# Patient Record
Sex: Male | Born: 1970 | Race: White | Hispanic: No | Marital: Married | Smoking: Current every day smoker
Health system: Southern US, Community
[De-identification: ages and names within clinical notes are randomized; demographics above are authoritative.]

## PROBLEM LIST (undated history)

## (undated) DIAGNOSIS — M549 Dorsalgia, unspecified: Secondary | ICD-10-CM

## (undated) DIAGNOSIS — M7710 Lateral epicondylitis, unspecified elbow: Secondary | ICD-10-CM

## (undated) DIAGNOSIS — E785 Hyperlipidemia, unspecified: Secondary | ICD-10-CM

## (undated) DIAGNOSIS — H269 Unspecified cataract: Secondary | ICD-10-CM

## (undated) HISTORY — DX: Hyperlipidemia, unspecified: E78.5

## (undated) HISTORY — DX: Dorsalgia, unspecified: M54.9

## (undated) HISTORY — DX: Unspecified cataract: H26.9

## (undated) HISTORY — DX: Lateral epicondylitis, unspecified elbow: M77.10

## (undated) HISTORY — PX: FRACTURE SURGERY: SHX138

## (undated) HISTORY — PX: CATARACT EXTRACTION: SUR2

## (undated) HISTORY — PX: LEG SURGERY: SHX1003

---

## 2009-05-20 ENCOUNTER — Ambulatory Visit: Payer: Self-pay | Admitting: Family Medicine

## 2010-03-07 ENCOUNTER — Ambulatory Visit: Payer: Self-pay | Admitting: Chiropractor

## 2011-10-29 ENCOUNTER — Ambulatory Visit: Payer: Self-pay | Admitting: Internal Medicine

## 2015-05-08 ENCOUNTER — Other Ambulatory Visit: Payer: Self-pay | Admitting: Internal Medicine

## 2015-07-04 ENCOUNTER — Other Ambulatory Visit: Payer: Self-pay | Admitting: Internal Medicine

## 2015-07-07 ENCOUNTER — Other Ambulatory Visit: Payer: Self-pay | Admitting: Internal Medicine

## 2015-07-09 ENCOUNTER — Encounter: Payer: Self-pay | Admitting: Family Medicine

## 2015-07-09 ENCOUNTER — Ambulatory Visit (INDEPENDENT_AMBULATORY_CARE_PROVIDER_SITE_OTHER): Payer: No Typology Code available for payment source | Admitting: Family Medicine

## 2015-07-09 VITALS — BP 120/80 | HR 72 | Ht 72.0 in | Wt 225.0 lb

## 2015-07-09 DIAGNOSIS — L0100 Impetigo, unspecified: Secondary | ICD-10-CM

## 2015-07-09 MED ORDER — SULFAMETHOXAZOLE-TRIMETHOPRIM 800-160 MG PO TABS: 1.0000 | ORAL_TABLET | Freq: Two times a day (BID) | ORAL | Status: DC

## 2015-07-09 MED ORDER — BACITRACIN-NEOMYCIN-POLYMYXIN 400-5-5000 EX OINT: 1.0000 "application " | TOPICAL_OINTMENT | Freq: Two times a day (BID) | CUTANEOUS | Status: DC

## 2015-07-09 NOTE — Progress Notes (Signed)
Name: Kevin Cobb   MRN: [id number]    DOB: [birth year]   Date:07/09/2015       Progress Note  Subjective  Chief Complaint  Chief Complaint  Patient presents with  . Sinusitis    inside nose is red, no drainage, no cough    Sinusitis This is a new problem. The current episode started 1 to 4 weeks ago. The problem is unchanged. There has been no fever. The pain is mild. Pertinent negatives include no chills, congestion, coughing, diaphoresis, ear pain, headaches, hoarse voice, neck pain, shortness of breath, sinus pressure, sneezing, sore throat or swollen glands. Past treatments include saline sprays. The treatment provided no relief.    No problem-specific assessment & plan notes found for this encounter.   History reviewed. No pertinent past medical history.  History reviewed. No pertinent past surgical history.  History reviewed. No pertinent family history.  Social History   Social History  . Marital Status: Married    Spouse Name: N/A  . Number of Children: N/A  . Years of Education: N/A   Occupational History  . Not on file.   Social History Main Topics  . Smoking status: Current Every Day Smoker  . Smokeless tobacco: Not on file  . Alcohol Use: No  . Drug Use: No  . Sexual Activity: Not Currently   Other Topics Concern  . Not on file   Social History Narrative  . No narrative on file    No Known Allergies   Review of Systems  Constitutional: Negative for fever, chills, weight loss, malaise/fatigue and diaphoresis.  HENT: Negative for congestion, ear discharge, ear pain, hoarse voice, sinus pressure, sneezing and sore throat.   Eyes: Negative for blurred vision.  Respiratory: Negative for cough, sputum production, shortness of breath and wheezing.   Cardiovascular: Negative for chest pain, palpitations and leg swelling.  Gastrointestinal: Negative for heartburn, nausea, abdominal pain, diarrhea, constipation, blood in stool and melena.   Genitourinary: Negative for dysuria, urgency, frequency and hematuria.  Musculoskeletal: Negative for myalgias, back pain, joint pain and neck pain.  Skin: Negative for rash.  Neurological: Negative for dizziness, tingling, sensory change, focal weakness and headaches.  Endo/Heme/Allergies: Negative for environmental allergies and polydipsia. Does not bruise/bleed easily.  Psychiatric/Behavioral: Negative for depression and suicidal ideas. The patient is not nervous/anxious and does not have insomnia.      Objective  Filed Vitals:   07/09/15 1446  BP: 120/80  Pulse: 72  Height: 6' (1.829 m)  Weight: 225 lb (102.059 kg)    Physical Exam  Constitutional: He is oriented to person, place, and time and well-developed, well-nourished, and in no distress.  HENT:  Head: Normocephalic.  Right Ear: External ear normal.  Left Ear: External ear normal.  Nose: Mucosal edema and sinus tenderness present.  Mouth/Throat: Oropharynx is clear and moist.  Honey crusted mucosa  Eyes: Conjunctivae and EOM are normal. Pupils are equal, round, and reactive to light. Right eye exhibits no discharge. Left eye exhibits no discharge. No scleral icterus.  Neck: Normal range of motion. Neck supple. No JVD present. No tracheal deviation present. No thyromegaly present.  Cardiovascular: Normal rate, regular rhythm, normal heart sounds and intact distal pulses.  Exam reveals no gallop and no friction rub.   No murmur heard. Pulmonary/Chest: Breath sounds normal. No respiratory distress. He has no wheezes. He has no rales.  Abdominal: Soft. Bowel sounds are normal. He exhibits no mass. There is no hepatosplenomegaly. There is no tenderness. There  is no rebound, no guarding and no CVA tenderness.  Musculoskeletal: Normal range of motion. He exhibits no edema or tenderness.  Lymphadenopathy:    He has no cervical adenopathy.  Neurological: He is alert and oriented to person, place, and time. He has normal  sensation, normal strength, normal reflexes and intact cranial nerves. No cranial nerve deficit.  Skin: Skin is warm. No rash noted.  Psychiatric: Mood and affect normal.      Assessment & Plan  Problem List Items Addressed This Visit    None    Visit Diagnoses    Impetigo    -  Primary    Relevant Medications    neomycin-bacitracin-polymyxin (NEOSPORIN) ointment    sulfamethoxazole-trimethoprim (BACTRIM DS,SEPTRA DS) 800-160 MG per tablet         Dr. [facility]  07/09/2015

## 2015-07-09 NOTE — Patient Instructions (Signed)
Community-Associated MRSA CA-MRSA stands for community-associated methicillin-resistant Staphylococcus aureus. MRSA is a type of bacteria that is resistant to some common antibiotics. It can cause infections in the skin and many other places in the body. Staphylococcus aureus, often called "staph," is a bacteria that normally lives on the skin or in the nose. Staph on the surface of the skin or in the nose does not cause problems. However, if the staph enters the body through a cut, wound, or break in the skin, an infection can happen. Up until recently, infections with the MRSA type of staph mainly occurred in hospitals and other health care settings. There are now increasing problems with MRSA infections in the community as well. Infections with MRSA may be very serious or even life threatening. CA-MRSA is becoming more common. It is known to spread in crowded settings, in jails and prisons, and in situations where there is close skin-to-skin contact, such as during sporting events or in locker rooms. MRSA can be spread through shared items, such as children's toys, razors, towels, or sports equipment.  CAUSES All staph, including MRSA, are normally harmless unless they enter the body through a scratch, cut, or wound, such as with surgery. All staph, including MRSA, can be spread from person-to-person by touching contaminated objects or through direct contact.  MRSA now causes illness in people who have not been in hospitals or other health care facilities. Cases of MRSA diseases in the community have been associated with:  Recent antibiotic use.  Sharing contaminated towels or clothes.  Having active skin diseases.  Participating in contact sports.  Living in crowded settings.  Intravenous (IV) drug use.  Community-associated MRSA infections are usually skin infections, but may cause other severe illnesses.  Staph bacteria are one of the most common causes of skin infection. However, they  are also a common cause of pneumonia, bone or joint infections, and bloodstream infections. DIAGNOSIS Diagnosis of MRSA is done by cultures of fluid samples that may come from:  Swabs taken from cuts or wounds in infected areas.  Nasal swabs.  Saliva or deep cough specimens from the lungs (sputum).  Urine.  Blood. Many people are "colonized" with MRSA but have no signs of infection. This means that people carry the MRSA germ on their skin or in their nose and may never develop MRSA infection.  TREATMENT  Treatment varies and is based on how serious, how deep, or how extensive the infection is. For example:  Some skin infections, such as a small boil or abscess, may be treated by draining yellowish-white fluid (pus) from the site of the infection.  Deeper or more widespread soft tissue infections are usually treated with surgery to drain pus and with antibiotic medicine given by vein or by mouth. This may be recommended even if you are pregnant.  Serious infections may require a hospital stay. If antibiotics are given, they may be needed for several weeks. PREVENTION Because many people are colonized with staph, including MRSA, preventing the spread of the bacteria from person-to-person is most important. The best way to prevent the spread of bacteria and other germs is through proper hand washing or by using alcohol-based hand disinfectants. The following are other ways to help prevent MRSA infection within community settings.   Wash your hands frequently with soap and water for at least 15 seconds. Otherwise, use alcohol-based hand disinfectants when soap and water is not available.  Make sure people who live with you wash their hands often, too.  Do not share personal items. For example, avoid sharing razors and other personal hygiene items, towels, clothing, and athletic equipment.  Wash and dry your clothes and bedding at the warmest temperatures recommended on the labels.  Keep  wounds covered. Pus from infected sores may contain MRSA and other bacteria. Keep cuts and abrasions clean and covered with germ-free (sterile), dry bandages until they are healed.  If you have a wound that appears infected, ask your caregiver if a culture for MRSA and other bacteria should be done.  If you are breastfeeding, talk to your caregiver about MRSA. You may be asked to temporarily stop breastfeeding. HOME CARE INSTRUCTIONS   Take your antibiotics as directed. Finish them even if you start to feel better.  Avoid close contact with those around you as much as possible. Do not use towels, razors, toothbrushes, bedding, or other items that will be used by others.  To fight the infection, follow your caregiver's instructions for wound care. Wash your hands before and after changing your bandages.  If you have an intravascular device, such as a catheter, make sure you know how to care for it.  Be sure to tell any health care providers that you have MRSA so they are aware of your infection. SEEK IMMEDIATE MEDICAL CARE IF:  The infection appears to be getting worse. Signs include:  Increased warmth, redness, or tenderness around the wound site.  A red line that extends from the infection site.  A dark color in the area around the infection.  Wound drainage that is tan, yellow, or green.  A bad smell coming from the wound.  You feel sick to your stomach (nauseous) and throw up (vomit) or cannot keep medicine down.  You have a fever.  Your baby is older than 3 months with a rectal temperature of 102F (38.9C) or higher.  Your baby is 66 months old or younger with a rectal temperature of 100.46F (38C) or higher.  You have difficulty breathing. MAKE SURE YOU:   Understand these instructions.  Will watch your condition.  Will get help right away if you are not doing well or get worse. Document Released: 02/12/2006 Document Revised: 03/26/2014 Document Reviewed:  02/12/2011 [facility] Patient Information 2015 [place], [place]. This information is not intended to replace advice given to you by your health care provider. Make sure you discuss any questions you have with your health care provider. Place pediatric impetigo patient instructions here.

## 2015-10-20 ENCOUNTER — Encounter: Payer: Self-pay | Admitting: Internal Medicine

## 2015-10-20 ENCOUNTER — Other Ambulatory Visit: Payer: Self-pay | Admitting: Internal Medicine

## 2015-10-20 DIAGNOSIS — E7800 Pure hypercholesterolemia, unspecified: Secondary | ICD-10-CM | POA: Insufficient documentation

## 2015-10-20 DIAGNOSIS — F17210 Nicotine dependence, cigarettes, uncomplicated: Secondary | ICD-10-CM | POA: Insufficient documentation

## 2015-10-20 DIAGNOSIS — Z720 Tobacco use: Secondary | ICD-10-CM | POA: Insufficient documentation

## 2015-10-20 DIAGNOSIS — R740 Nonspecific elevation of levels of transaminase and lactic acid dehydrogenase [LDH]: Secondary | ICD-10-CM

## 2015-10-20 DIAGNOSIS — M722 Plantar fascial fibromatosis: Secondary | ICD-10-CM | POA: Insufficient documentation

## 2015-10-20 DIAGNOSIS — IMO0002 Reserved for concepts with insufficient information to code with codable children: Secondary | ICD-10-CM | POA: Insufficient documentation

## 2015-10-21 ENCOUNTER — Encounter: Payer: Self-pay | Admitting: Internal Medicine

## 2015-10-28 ENCOUNTER — Ambulatory Visit: Payer: No Typology Code available for payment source | Admitting: Internal Medicine

## 2015-12-15 ENCOUNTER — Other Ambulatory Visit: Payer: Self-pay | Admitting: Internal Medicine

## 2015-12-15 ENCOUNTER — Encounter: Payer: Self-pay | Admitting: Internal Medicine

## 2015-12-15 DIAGNOSIS — R7303 Prediabetes: Secondary | ICD-10-CM | POA: Insufficient documentation

## 2015-12-25 NOTE — Telephone Encounter (Signed)
pts coming in on 2/27 for follow up and in may for cpe

## 2016-01-10 ENCOUNTER — Other Ambulatory Visit: Payer: Self-pay | Admitting: Internal Medicine

## 2016-01-10 ENCOUNTER — Ambulatory Visit (INDEPENDENT_AMBULATORY_CARE_PROVIDER_SITE_OTHER): Payer: No Typology Code available for payment source | Admitting: Internal Medicine

## 2016-01-10 ENCOUNTER — Encounter: Payer: Self-pay | Admitting: Internal Medicine

## 2016-01-10 VITALS — BP 116/66 | HR 89 | Temp 98.6°F | Ht 72.0 in | Wt 228.0 lb

## 2016-01-10 DIAGNOSIS — J0101 Acute recurrent maxillary sinusitis: Secondary | ICD-10-CM | POA: Diagnosis not present

## 2016-01-10 MED ORDER — GUAIFENESIN-CODEINE 100-10 MG/5ML PO SYRP: 5.0000 mL | ORAL_SOLUTION | Freq: Three times a day (TID) | ORAL | Status: DC | PRN

## 2016-01-10 MED ORDER — AMOXICILLIN-POT CLAVULANATE 875-125 MG PO TABS: 1.0000 | ORAL_TABLET | Freq: Two times a day (BID) | ORAL | Status: DC

## 2016-01-10 NOTE — Progress Notes (Signed)
Date:  01/10/2016   Name:  Kevin Cobb   DOB:  03-Feb-1971   MRN:  [id number]   Chief Complaint: Sinusitis Sinusitis This is a chronic problem. The current episode started more than 1 month ago. The problem is unchanged. There has been no fever. He is experiencing no pain. Associated symptoms include congestion, coughing, ear pain, sinus pressure and a sore throat. Pertinent negatives include no headaches or shortness of breath. Past treatments include oral decongestants. The treatment provided mild relief.      Review of Systems  HENT: Positive for congestion, ear pain, sinus pressure and sore throat.   Respiratory: Positive for cough. Negative for chest tightness, shortness of breath and wheezing.   Cardiovascular: Negative for chest pain and palpitations.  Gastrointestinal: Negative for vomiting and diarrhea.  Neurological: Negative for headaches.    Patient Active Problem List   Diagnosis Date Noted  . Pre-diabetes 12/15/2015  . Elevation of level of transaminase or lactic acid dehydrogenase (LDH) 10/20/2015  . Current tobacco use 10/20/2015  . Hypercholesteremia 10/20/2015  . Plantar fasciitis 10/20/2015    Prior to Admission medications   Medication Sig Start Date End Date Taking? Authorizing Provider  aspirin 81 MG tablet Take 81 mg by mouth daily.    Historical Provider, MD  cyclobenzaprine (FLEXERIL) 10 MG tablet TAKE 1 TABLET BY MOUTH AT BEDTIME 12/15/15   Glean Hess, MD  meloxicam (MOBIC) 15 MG tablet TAKE 1 TABLET BY MOUTH DAILY 12/15/15   Glean Hess, MD  neomycin-bacitracin-polymyxin (NEOSPORIN) ointment Apply 1 application topically every 12 (twelve) hours. apply to nasal mucosa 07/09/15   Juline Patch, MD  simvastatin (ZOCOR) 20 MG tablet TAKE 1 TABLET BY MOUTH EVERY DAY 07/07/15   Glean Hess, MD  simvastatin (ZOCOR) 20 MG tablet TAKE 1 TABLET BY MOUTH EVERY DAY 01/10/16   Glean Hess, MD    No Known Allergies  Past Surgical History    Procedure Laterality Date  . Leg surgery Right     childhood    Social History  Substance Use Topics  . Smoking status: Current Every Day Smoker -- 1.00 packs/day for 25 years    Types: Cigarettes  . Smokeless tobacco: None  . Alcohol Use: No     Medication list has been reviewed and updated.   Physical Exam  Constitutional: He is oriented to person, place, and time. He appears well-developed and well-nourished.  HENT:  Right Ear: External ear normal.  Nose: Right sinus exhibits maxillary sinus tenderness and frontal sinus tenderness. Left sinus exhibits maxillary sinus tenderness and frontal sinus tenderness.  Mouth/Throat: Uvula is midline and mucous membranes are normal. No oral lesions. Posterior oropharyngeal erythema present. No oropharyngeal exudate.  Bilateral excessive cerumen  Cardiovascular: Normal rate, regular rhythm and normal heart sounds.   Pulmonary/Chest: Breath sounds normal. He has no wheezes. He has no rales.  Lymphadenopathy:    He has no cervical adenopathy.  Neurological: He is alert and oriented to person, place, and time.    BP 116/66 mmHg  Pulse 89  Temp(Src) 98.6 F (37 C) (Oral)  Ht 6' (1.829 m)  Wt 228 lb (103.42 kg)  BMI 30.92 kg/m2  SpO2 98%  Assessment and Plan: 1. Acute recurrent maxillary sinusitis Continue Mucinex Sinus bid Increase fluid intake - amoxicillin-clavulanate (AUGMENTIN) 875-125 MG tablet; Take 1 tablet by mouth 2 (two) times daily.  Dispense: 20 tablet; Refill: 0 - guaiFENesin-codeine (ROBITUSSIN AC) 100-10 MG/5ML syrup; Take 5 mLs  by mouth 3 (three) times daily as needed for cough.  Dispense: 150 mL; Refill: 0   Halina Maidens, MD [facility]  01/10/2016

## 2016-01-20 ENCOUNTER — Ambulatory Visit: Payer: No Typology Code available for payment source | Admitting: Internal Medicine

## 2016-01-23 ENCOUNTER — Other Ambulatory Visit: Payer: Self-pay | Admitting: Internal Medicine

## 2016-01-29 ENCOUNTER — Other Ambulatory Visit: Payer: Self-pay | Admitting: Internal Medicine

## 2016-04-08 ENCOUNTER — Other Ambulatory Visit: Payer: Self-pay | Admitting: Internal Medicine

## 2016-04-08 ENCOUNTER — Ambulatory Visit (INDEPENDENT_AMBULATORY_CARE_PROVIDER_SITE_OTHER): Payer: No Typology Code available for payment source | Admitting: Internal Medicine

## 2016-04-08 ENCOUNTER — Encounter: Payer: Self-pay | Admitting: Internal Medicine

## 2016-04-08 VITALS — BP 108/78 | HR 91 | Resp 16 | Ht 72.0 in | Wt 230.0 lb

## 2016-04-08 DIAGNOSIS — R740 Nonspecific elevation of levels of transaminase and lactic acid dehydrogenase [LDH]: Secondary | ICD-10-CM

## 2016-04-08 DIAGNOSIS — J0110 Acute frontal sinusitis, unspecified: Secondary | ICD-10-CM | POA: Diagnosis not present

## 2016-04-08 DIAGNOSIS — M545 Low back pain, unspecified: Secondary | ICD-10-CM | POA: Insufficient documentation

## 2016-04-08 DIAGNOSIS — IMO0002 Reserved for concepts with insufficient information to code with codable children: Secondary | ICD-10-CM

## 2016-04-08 DIAGNOSIS — E7800 Pure hypercholesterolemia, unspecified: Secondary | ICD-10-CM | POA: Diagnosis not present

## 2016-04-08 DIAGNOSIS — J0101 Acute recurrent maxillary sinusitis: Secondary | ICD-10-CM | POA: Diagnosis not present

## 2016-04-08 DIAGNOSIS — R7303 Prediabetes: Secondary | ICD-10-CM | POA: Diagnosis not present

## 2016-04-08 DIAGNOSIS — Z0000 Encounter for general adult medical examination without abnormal findings: Secondary | ICD-10-CM

## 2016-04-08 DIAGNOSIS — Z720 Tobacco use: Secondary | ICD-10-CM

## 2016-04-08 LAB — POCT URINALYSIS DIPSTICK
BILIRUBIN UA: NEGATIVE
Blood, UA: NEGATIVE
Glucose, UA: NEGATIVE
Ketones, UA: NEGATIVE
LEUKOCYTES UA: NEGATIVE
NITRITE UA: NEGATIVE
PH UA: 6.5
Protein, UA: NEGATIVE
Spec Grav, UA: 1.015
UROBILINOGEN UA: 0.2

## 2016-04-08 MED ORDER — AMOXICILLIN-POT CLAVULANATE 875-125 MG PO TABS: 1.0000 | ORAL_TABLET | Freq: Two times a day (BID) | ORAL | Status: DC

## 2016-04-08 MED ORDER — GUAIFENESIN-CODEINE 100-10 MG/5ML PO SYRP: 5.0000 mL | ORAL_SOLUTION | Freq: Three times a day (TID) | ORAL | Status: DC | PRN

## 2016-04-08 NOTE — Progress Notes (Signed)
Date:  04/08/2016   Name:  NIKODEM LEADBETTER   DOB:  10-Jul-1971   MRN:  [id number]   Chief Complaint: Annual Exam; Nicotine Dependence; and Sinus Problem Dieter GAETANO ROMBERGER is a 45 y.o. male who presents today for his Complete Annual Exam. He feels fairly well. He reports exercising very little. He reports he is sleeping well.   Nicotine Dependence Symptoms include sore throat. Symptoms are negative for fatigue. His urge triggers include company of smokers.  Sinus Problem Associated symptoms include congestion, coughing, sinus pressure and a sore throat. Pertinent negatives include no chills, diaphoresis, headaches or shortness of breath.  Hyperlipidemia This is a chronic problem. The current episode started more than 1 year ago. The problem is controlled. Recent lipid tests were reviewed and are normal. Pertinent negatives include no chest pain, myalgias or shortness of breath. Current antihyperlipidemic treatment includes statins. The current treatment provides significant improvement of lipids. There are no compliance problems.      Review of Systems  Constitutional: Negative for chills, diaphoresis, appetite change, fatigue and unexpected weight change.  HENT: Positive for congestion, postnasal drip, sinus pressure and sore throat. Negative for hearing loss, tinnitus, trouble swallowing and voice change.   Eyes: Negative for visual disturbance.  Respiratory: Positive for cough. Negative for choking, shortness of breath and wheezing.   Cardiovascular: Negative for chest pain, palpitations and leg swelling.  Gastrointestinal: Negative for abdominal pain, diarrhea, constipation and blood in stool.  Genitourinary: Negative for dysuria, frequency and difficulty urinating.  Musculoskeletal: Negative for myalgias, back pain and arthralgias.  Skin: Negative for color change and rash.  Allergic/Immunologic: Positive for environmental allergies.  Neurological: Negative for dizziness, syncope  and headaches.  Hematological: Negative for adenopathy.  Psychiatric/Behavioral: Negative for sleep disturbance and dysphoric mood.    Patient Active Problem List   Diagnosis Date Noted  . Back pain, lumbosacral 04/08/2016  . Pre-diabetes 12/15/2015  . Elevation of level of transaminase or lactic acid dehydrogenase (LDH) 10/20/2015  . Current tobacco use 10/20/2015  . Hypercholesteremia 10/20/2015  . Plantar fasciitis 10/20/2015    Prior to Admission medications   Medication Sig Start Date End Date Taking? Authorizing Provider  cyclobenzaprine (FLEXERIL) 10 MG tablet TAKE 1 TABLET BY MOUTH AT BEDTIME 01/24/16  Yes Glean Hess, MD  meloxicam (MOBIC) 15 MG tablet TAKE 1 TABLET BY MOUTH DAILY 01/30/16  Yes Glean Hess, MD  simvastatin (ZOCOR) 20 MG tablet TAKE 1 TABLET BY MOUTH EVERY DAY 01/10/16  Yes Glean Hess, MD  aspirin 81 MG tablet Take 81 mg by mouth daily. Reported on 04/08/2016    Historical Provider, MD    No Known Allergies  Past Surgical History  Procedure Laterality Date  . Leg surgery Right     childhood    Social History  Substance Use Topics  . Smoking status: Current Every Day Smoker -- 1.00 packs/day for 25 years    Types: Cigarettes  . Smokeless tobacco: None  . Alcohol Use: No     Medication list has been reviewed and updated.   Physical Exam  Constitutional: He is oriented to person, place, and time. He appears well-developed and well-nourished.  HENT:  Head: Normocephalic.  Right Ear: Tympanic membrane, external ear and ear canal normal.  Left Ear: Tympanic membrane, external ear and ear canal normal.  Nose: Right sinus exhibits maxillary sinus tenderness. Left sinus exhibits maxillary sinus tenderness.  Mouth/Throat: Uvula is midline. Posterior oropharyngeal erythema present. No posterior oropharyngeal edema.  Eyes: Conjunctivae and EOM are normal. Pupils are equal, round, and reactive to light.  Neck: Normal range of motion. Neck  supple. Carotid bruit is not present. No thyromegaly present.  Cardiovascular: Normal rate, regular rhythm, normal heart sounds and intact distal pulses.   Pulmonary/Chest: Effort normal and breath sounds normal. He has no wheezes. Right breast exhibits no mass. Left breast exhibits no mass.  Abdominal: Soft. Normal appearance and bowel sounds are normal. There is no hepatosplenomegaly. There is no tenderness.  Musculoskeletal: Normal range of motion.  Lymphadenopathy:    He has no cervical adenopathy.  Neurological: He is alert and oriented to person, place, and time. He has normal reflexes.  Skin: Skin is warm, dry and intact.  Psychiatric: He has a normal mood and affect. His speech is normal and behavior is normal. Judgment and thought content normal.  Nursing note and vitals reviewed.   BP 108/78 mmHg  Pulse 91  Resp 16  Ht 6' (1.829 m)  Wt 230 lb (104.327 kg)  BMI 31.19 kg/m2  SpO2 96%  Assessment and Plan: 1. Annual physical exam Encouraged weight loss and exercise - CBC with Differential/Platelet - POCT urinalysis dipstick  2. Current tobacco use Wants to quit with his wife who is not ready - Nurse to provide smoking / tobacco cessation education  3. Elevation of level of transaminase or lactic acid dehydrogenase (LDH) - Comprehensive metabolic panel  4. Hypercholesteremia Continue statin therapy - Lipid panel  5. Pre-diabetes Re-enforced diet and exercise - Comprehensive metabolic panel - Hemoglobin A1c   7. Acute recurrent maxillary sinusitis Continue sudafed PRN - amoxicillin-clavulanate (AUGMENTIN) 875-125 MG tablet; Take 1 tablet by mouth 2 (two) times daily.  Dispense: 20 tablet; Refill: 0 - guaiFENesin-codeine (ROBITUSSIN AC) 100-10 MG/5ML syrup; Take 5 mLs by mouth 3 (three) times daily as needed for cough.  Dispense: 150 mL; Refill: 0   Halina Maidens, MD [facility]  04/08/2016

## 2016-04-09 LAB — CBC WITH DIFFERENTIAL/PLATELET
Basophils Absolute: 0.1 10*3/uL (ref 0.0–0.2)
Basos: 0 %
EOS (ABSOLUTE): 0.2 10*3/uL (ref 0.0–0.4)
EOS: 1 %
HEMATOCRIT: 46.9 % (ref 37.5–51.0)
HEMOGLOBIN: 15.7 g/dL (ref 12.6–17.7)
Immature Grans (Abs): 0 10*3/uL (ref 0.0–0.1)
Immature Granulocytes: 0 %
LYMPHS ABS: 3.1 10*3/uL (ref 0.7–3.1)
Lymphs: 18 %
MCH: 31.2 pg (ref 26.6–33.0)
MCHC: 33.5 g/dL (ref 31.5–35.7)
MCV: 93 fL (ref 79–97)
MONOCYTES: 8 %
MONOS ABS: 1.4 10*3/uL — AB (ref 0.1–0.9)
NEUTROS ABS: 12.1 10*3/uL — AB (ref 1.4–7.0)
Neutrophils: 73 %
Platelets: 268 10*3/uL (ref 150–379)
RBC: 5.03 x10E6/uL (ref 4.14–5.80)
RDW: 13.8 % (ref 12.3–15.4)
WBC: 16.8 10*3/uL — ABNORMAL HIGH (ref 3.4–10.8)

## 2016-04-09 LAB — LIPID PANEL
CHOLESTEROL TOTAL: 174 mg/dL (ref 100–199)
Chol/HDL Ratio: 5.4 ratio units — ABNORMAL HIGH (ref 0.0–5.0)
HDL: 32 mg/dL — ABNORMAL LOW (ref >39)
LDL CALC: 112 mg/dL — AB (ref 0–99)
Triglycerides: 152 mg/dL — ABNORMAL HIGH (ref 0–149)
VLDL Cholesterol Cal: 30 mg/dL (ref 5–40)

## 2016-04-09 LAB — COMPREHENSIVE METABOLIC PANEL
A/G RATIO: 1.8 (ref 1.2–2.2)
ALBUMIN: 4.8 g/dL (ref 3.5–5.5)
ALK PHOS: 55 IU/L (ref 39–117)
ALT: 40 IU/L (ref 0–44)
AST: 25 IU/L (ref 0–40)
BILIRUBIN TOTAL: 0.5 mg/dL (ref 0.0–1.2)
BUN / CREAT RATIO: 13 (ref 9–20)
BUN: 13 mg/dL (ref 6–24)
CO2: 24 mmol/L (ref 18–29)
CREATININE: 1 mg/dL (ref 0.76–1.27)
Calcium: 9.5 mg/dL (ref 8.7–10.2)
Chloride: 99 mmol/L (ref 96–106)
GFR calc Af Amer: 106 mL/min/{1.73_m2} (ref >59)
GFR calc non Af Amer: 92 mL/min/{1.73_m2} (ref >59)
GLOBULIN, TOTAL: 2.7 g/dL (ref 1.5–4.5)
Glucose: 82 mg/dL (ref 65–99)
POTASSIUM: 4.2 mmol/L (ref 3.5–5.2)
SODIUM: 141 mmol/L (ref 134–144)
Total Protein: 7.5 g/dL (ref 6.0–8.5)

## 2016-04-09 LAB — HEMOGLOBIN A1C
ESTIMATED AVERAGE GLUCOSE: 123 mg/dL
Hgb A1c MFr Bld: 5.9 % — ABNORMAL HIGH (ref 4.8–5.6)

## 2016-06-25 ENCOUNTER — Ambulatory Visit (INDEPENDENT_AMBULATORY_CARE_PROVIDER_SITE_OTHER): Payer: No Typology Code available for payment source | Admitting: Family Medicine

## 2016-06-25 ENCOUNTER — Encounter: Payer: Self-pay | Admitting: Family Medicine

## 2016-06-25 VITALS — BP 120/70 | HR 68 | Ht 72.0 in | Wt 226.0 lb

## 2016-06-25 DIAGNOSIS — J0100 Acute maxillary sinusitis, unspecified: Secondary | ICD-10-CM | POA: Diagnosis not present

## 2016-06-25 MED ORDER — AMOXICILLIN-POT CLAVULANATE 875-125 MG PO TABS: 1.0000 | ORAL_TABLET | Freq: Two times a day (BID) | ORAL | 0 refills | Status: DC

## 2016-06-25 MED ORDER — GUAIFENESIN-CODEINE 100-10 MG/5ML PO SYRP: 5.0000 mL | ORAL_SOLUTION | Freq: Three times a day (TID) | ORAL | 0 refills | Status: DC | PRN

## 2016-06-25 NOTE — Progress Notes (Signed)
Name: Kevin Cobb   MRN: [id number]    DOB: Nov 05, 1971   Date:06/25/2016       Progress Note  Subjective  Chief Complaint  Chief Complaint  Patient presents with  . Sinusitis    R) side of face pressure, dk yellow production, bloody drainage from R) side, teeth hurting, cough with some production off and on x 3 weeks    Sinusitis  This is a new problem. The current episode started 1 to 4 weeks ago. The problem has been gradually worsening since onset. There has been no fever. The fever has been present for less than 1 day. The pain is moderate. Associated symptoms include chills, congestion, coughing, diaphoresis, sinus pressure and sneezing. Pertinent negatives include no ear pain, headaches, hoarse voice, neck pain, shortness of breath, sore throat or swollen glands. Past treatments include acetaminophen and oral decongestants. The treatment provided moderate relief.    No problem-specific Assessment & Plan notes found for this encounter.   Past Medical History:  Diagnosis Date  . Back pain   . Hyperlipidemia     Past Surgical History:  Procedure Laterality Date  . LEG SURGERY Right    childhood    Family History  Problem Relation Age of Onset  . Diabetes Mother   . Brain cancer Father     Social History   Social History  . Marital status: Married    Spouse name: N/A  . Number of children: N/A  . Years of education: N/A   Occupational History  . Not on file.   Social History Main Topics  . Smoking status: Current Every Day Smoker    Packs/day: 1.00    Years: 25.00    Types: Cigarettes  . Smokeless tobacco: Never Used  . Alcohol use No  . Drug use: No  . Sexual activity: Not Currently   Other Topics Concern  . Not on file   Social History Narrative  . No narrative on file    No Known Allergies   Review of Systems  Constitutional: Positive for chills and diaphoresis. Negative for fever, malaise/fatigue and weight loss.  HENT: Positive for  congestion, sinus pressure and sneezing. Negative for ear discharge, ear pain, hoarse voice and sore throat.   Eyes: Negative for blurred vision.  Respiratory: Positive for cough. Negative for sputum production, shortness of breath and wheezing.   Cardiovascular: Negative for chest pain, palpitations and leg swelling.  Gastrointestinal: Negative for abdominal pain, blood in stool, constipation, diarrhea, heartburn, melena and nausea.  Genitourinary: Negative for dysuria, frequency, hematuria and urgency.  Musculoskeletal: Negative for back pain, joint pain, myalgias and neck pain.  Skin: Negative for rash.  Neurological: Negative for dizziness, tingling, sensory change, focal weakness and headaches.  Endo/Heme/Allergies: Negative for environmental allergies and polydipsia. Does not bruise/bleed easily.  Psychiatric/Behavioral: Negative for depression and suicidal ideas. The patient is not nervous/anxious and does not have insomnia.      Objective  Vitals:   06/25/16 1418  BP: 120/70  Pulse: 68  Weight: 226 lb (102.5 kg)  Height: 6' (1.829 m)    Physical Exam  Constitutional: He is oriented to person, place, and time and well-developed, well-nourished, and in no distress.  HENT:  Head: Normocephalic.  Right Ear: External ear normal.  Left Ear: External ear normal.  Nose: Nose normal.  Mouth/Throat: Oropharynx is clear and moist.  Eyes: Conjunctivae and EOM are normal. Pupils are equal, round, and reactive to light. Right eye exhibits no discharge. Left  eye exhibits no discharge. No scleral icterus.  Neck: Normal range of motion. Neck supple. No JVD present. No tracheal deviation present. No thyromegaly present.  Cardiovascular: Normal rate, regular rhythm, normal heart sounds and intact distal pulses.  Exam reveals no gallop and no friction rub.   No murmur heard. Pulmonary/Chest: Breath sounds normal. No respiratory distress. He has no wheezes. He has no rales.  Abdominal: Soft.  Bowel sounds are normal. He exhibits no mass. There is no hepatosplenomegaly. There is no tenderness. There is no rebound, no guarding and no CVA tenderness.  Musculoskeletal: Normal range of motion. He exhibits no edema or tenderness.  Lymphadenopathy:    He has no cervical adenopathy.  Neurological: He is alert and oriented to person, place, and time. He has normal sensation, normal strength, normal reflexes and intact cranial nerves. No cranial nerve deficit.  Skin: Skin is warm. No rash noted.  Psychiatric: Mood and affect normal.  Nursing note and vitals reviewed.     Assessment & Plan  Problem List Items Addressed This Visit    None    Visit Diagnoses    Acute maxillary sinusitis, recurrence not specified    -  Primary   Relevant Medications   amoxicillin-clavulanate (AUGMENTIN) 875-125 MG tablet   guaiFENesin-codeine (ROBITUSSIN AC) 100-10 MG/5ML syrup        Dr. Anubis [facility]  06/25/16

## 2016-07-04 ENCOUNTER — Other Ambulatory Visit: Payer: Self-pay | Admitting: Internal Medicine

## 2016-08-01 ENCOUNTER — Other Ambulatory Visit: Payer: Self-pay | Admitting: Internal Medicine

## 2017-01-14 ENCOUNTER — Encounter: Payer: Self-pay | Admitting: Internal Medicine

## 2017-02-02 ENCOUNTER — Other Ambulatory Visit: Payer: Self-pay | Admitting: Internal Medicine

## 2017-03-09 ENCOUNTER — Ambulatory Visit (INDEPENDENT_AMBULATORY_CARE_PROVIDER_SITE_OTHER): Payer: No Typology Code available for payment source | Admitting: Internal Medicine

## 2017-03-09 ENCOUNTER — Encounter: Payer: Self-pay | Admitting: Internal Medicine

## 2017-03-09 VITALS — BP 118/74 | HR 84 | Ht 72.0 in | Wt 230.0 lb

## 2017-03-09 DIAGNOSIS — M722 Plantar fascial fibromatosis: Secondary | ICD-10-CM

## 2017-03-09 DIAGNOSIS — Z0000 Encounter for general adult medical examination without abnormal findings: Secondary | ICD-10-CM | POA: Diagnosis not present

## 2017-03-09 DIAGNOSIS — E7800 Pure hypercholesterolemia, unspecified: Secondary | ICD-10-CM

## 2017-03-09 DIAGNOSIS — R7303 Prediabetes: Secondary | ICD-10-CM | POA: Diagnosis not present

## 2017-03-09 DIAGNOSIS — Z720 Tobacco use: Secondary | ICD-10-CM | POA: Diagnosis not present

## 2017-03-09 LAB — POCT URINALYSIS DIPSTICK
BILIRUBIN UA: NEGATIVE
Blood, UA: NEGATIVE
GLUCOSE UA: NEGATIVE
KETONES UA: NEGATIVE
LEUKOCYTES UA: NEGATIVE
NITRITE UA: NEGATIVE
PH UA: 6.5 (ref 5.0–8.0)
Spec Grav, UA: 1.015 (ref 1.010–1.025)
Urobilinogen, UA: 0.2 E.U./dL

## 2017-03-09 MED ORDER — CYCLOBENZAPRINE HCL 10 MG PO TABS: 10.0000 mg | ORAL_TABLET | Freq: Every day | ORAL | 1 refills | Status: DC

## 2017-03-09 NOTE — Patient Instructions (Signed)

## 2017-03-09 NOTE — Progress Notes (Signed)
Date:  03/09/2017   Name:  Kevin Cobb   DOB:  1970-12-07   MRN:  [id number]   Chief Complaint: Annual Exam Kevin Cobb is a 46 y.o. male who presents today for his Complete Annual Exam. He feels well. He reports exercising none. He reports he is sleeping well. He continues to smoke and is not interested in quitting - worried about gaining weight.  Hyperlipidemia  This is a chronic problem. Pertinent negatives include no chest pain, myalgias or shortness of breath. Current antihyperlipidemic treatment includes statins. The current treatment provides significant improvement of lipids. There are no compliance problems.     Review of Systems  Constitutional: Negative for appetite change, chills, diaphoresis, fatigue and unexpected weight change.  HENT: Negative for hearing loss, tinnitus, trouble swallowing and voice change.   Eyes: Negative for visual disturbance.  Respiratory: Negative for choking, shortness of breath and wheezing.   Cardiovascular: Negative for chest pain, palpitations and leg swelling.  Gastrointestinal: Negative for abdominal pain, blood in stool, constipation and diarrhea.  Endocrine: Negative for polydipsia.  Genitourinary: Negative for difficulty urinating, dysuria and frequency.  Musculoskeletal: Positive for gait problem (pain in both heels in the AM). Negative for arthralgias, back pain and myalgias.  Skin: Negative for color change and rash.  Allergic/Immunologic: Negative for environmental allergies.  Neurological: Negative for dizziness, syncope and headaches.  Hematological: Negative for adenopathy.  Psychiatric/Behavioral: Negative for dysphoric mood and sleep disturbance.    Patient Active Problem List   Diagnosis Date Noted  . Back pain, lumbosacral 04/08/2016  . Pre-diabetes 12/15/2015  . Current tobacco use 10/20/2015  . Hypercholesteremia 10/20/2015  . Plantar fasciitis 10/20/2015    Prior to Admission medications   Medication Sig  Start Date End Date Taking? Authorizing Provider  aspirin 81 MG tablet Take 81 mg by mouth daily. Reported on 04/08/2016    Historical Provider, MD  cyclobenzaprine (FLEXERIL) 10 MG tablet TAKE 1 TABLET BY MOUTH AT BEDTIME 08/02/16   Glean Hess, MD  meloxicam (MOBIC) 15 MG tablet TAKE 1 TABLET BY MOUTH DAILY 02/02/17   Glean Hess, MD  simvastatin (ZOCOR) 20 MG tablet TAKE 1 TABLET BY MOUTH EVERY DAY 07/04/16   Glean Hess, MD    No Known Allergies  Past Surgical History:  Procedure Laterality Date  . LEG SURGERY Right    childhood    Social History  Substance Use Topics  . Smoking status: Current Every Day Smoker    Packs/day: 1.00    Years: 25.00    Types: Cigarettes  . Smokeless tobacco: Never Used  . Alcohol use No   Depression screen [facility] 2/9 03/09/2017 04/08/2016  Decreased Interest 0 0  Down, Depressed, Hopeless 0 0  PHQ - 2 Score 0 0     Medication list has been reviewed and updated.   Physical Exam  Constitutional: He is oriented to person, place, and time. He appears well-developed and well-nourished.  HENT:  Head: Normocephalic.  Right Ear: Tympanic membrane, external ear and ear canal normal.  Left Ear: Tympanic membrane, external ear and ear canal normal.  Nose: Nose normal.  Mouth/Throat: Uvula is midline and oropharynx is clear and moist.  Eyes: Conjunctivae and EOM are normal. Pupils are equal, round, and reactive to light.  Neck: Normal range of motion. Neck supple. Carotid bruit is not present. No thyromegaly present.  Cardiovascular: Normal rate, regular rhythm, normal heart sounds and intact distal pulses.   Pulmonary/Chest: Effort normal and  breath sounds normal. He has no wheezes. Right breast exhibits no mass. Left breast exhibits no mass.  Abdominal: Soft. Normal appearance and bowel sounds are normal. There is no hepatosplenomegaly. There is no tenderness.  Musculoskeletal: Normal range of motion.  Lymphadenopathy:    He has no  cervical adenopathy.  Neurological: He is alert and oriented to person, place, and time. He has normal reflexes.  Skin: Skin is warm, dry and intact.  Psychiatric: He has a normal mood and affect. His speech is normal and behavior is normal. Judgment and thought content normal.  Nursing note and vitals reviewed.   BP 118/74 (BP Location: Right Arm, Patient Position: Sitting, Cuff Size: Large)   Pulse 84   Ht 6' (1.829 m)   Wt 230 lb (104.3 kg)   SpO2 98%   BMI 31.19 kg/m   Assessment and Plan: 1. Annual physical exam Recommend regular exercise - POCT urinalysis dipstick  2. Hypercholesteremia Continue statin therapy - Lipid panel  3. Pre-diabetes Re-inforced diet and weight control - Comprehensive metabolic panel - Hemoglobin A1c  4. Current tobacco use Not interested in quitting at this time Options discussed - CBC with Differential/Platelet  5. Plantar fasciitis Use Mobic and ice as needed   Meds ordered this encounter  Medications  . cyclobenzaprine (FLEXERIL) 10 MG tablet    Sig: Take 1 tablet (10 mg total) by mouth at bedtime.    Dispense:  90 tablet    Refill:  Dale, MD [facility]  03/09/2017

## 2017-03-10 LAB — CBC WITH DIFFERENTIAL/PLATELET
BASOS ABS: 0.1 10*3/uL (ref 0.0–0.2)
Basos: 1 %
EOS (ABSOLUTE): 0.2 10*3/uL (ref 0.0–0.4)
Eos: 3 %
HEMOGLOBIN: 15.1 g/dL (ref 13.0–17.7)
Hematocrit: 45.1 % (ref 37.5–51.0)
IMMATURE GRANS (ABS): 0 10*3/uL (ref 0.0–0.1)
IMMATURE GRANULOCYTES: 0 %
LYMPHS: 36 %
Lymphocytes Absolute: 3.2 10*3/uL — ABNORMAL HIGH (ref 0.7–3.1)
MCH: 30.9 pg (ref 26.6–33.0)
MCHC: 33.5 g/dL (ref 31.5–35.7)
MCV: 92 fL (ref 79–97)
MONOCYTES: 11 %
Monocytes Absolute: 1 10*3/uL — ABNORMAL HIGH (ref 0.1–0.9)
NEUTROS PCT: 49 %
Neutrophils Absolute: 4.4 10*3/uL (ref 1.4–7.0)
PLATELETS: 225 10*3/uL (ref 150–379)
RBC: 4.88 x10E6/uL (ref 4.14–5.80)
RDW: 13.6 % (ref 12.3–15.4)
WBC: 8.9 10*3/uL (ref 3.4–10.8)

## 2017-03-10 LAB — LIPID PANEL
Chol/HDL Ratio: 4.4 ratio (ref 0.0–5.0)
Cholesterol, Total: 155 mg/dL (ref 100–199)
HDL: 35 mg/dL — AB (ref >39)
LDL CALC: 101 mg/dL — AB (ref 0–99)
Triglycerides: 96 mg/dL (ref 0–149)
VLDL CHOLESTEROL CAL: 19 mg/dL (ref 5–40)

## 2017-03-10 LAB — HEMOGLOBIN A1C
ESTIMATED AVERAGE GLUCOSE: 120 mg/dL
HEMOGLOBIN A1C: 5.8 % — AB (ref 4.8–5.6)

## 2017-03-10 LAB — COMPREHENSIVE METABOLIC PANEL
ALBUMIN: 4.9 g/dL (ref 3.5–5.5)
ALT: 37 IU/L (ref 0–44)
AST: 22 IU/L (ref 0–40)
Albumin/Globulin Ratio: 2 (ref 1.2–2.2)
Alkaline Phosphatase: 57 IU/L (ref 39–117)
BILIRUBIN TOTAL: 0.6 mg/dL (ref 0.0–1.2)
BUN/Creatinine Ratio: 15 (ref 9–20)
BUN: 13 mg/dL (ref 6–24)
CHLORIDE: 100 mmol/L (ref 96–106)
CO2: 23 mmol/L (ref 18–29)
Calcium: 9 mg/dL (ref 8.7–10.2)
Creatinine, Ser: 0.87 mg/dL (ref 0.76–1.27)
GFR calc non Af Amer: 105 mL/min/{1.73_m2} (ref >59)
GFR, EST AFRICAN AMERICAN: 121 mL/min/{1.73_m2} (ref >59)
GLUCOSE: 97 mg/dL (ref 65–99)
Globulin, Total: 2.4 g/dL (ref 1.5–4.5)
Potassium: 4.1 mmol/L (ref 3.5–5.2)
Sodium: 141 mmol/L (ref 134–144)
TOTAL PROTEIN: 7.3 g/dL (ref 6.0–8.5)

## 2017-03-20 ENCOUNTER — Other Ambulatory Visit: Payer: Self-pay | Admitting: Internal Medicine

## 2017-04-10 ENCOUNTER — Other Ambulatory Visit: Payer: Self-pay | Admitting: Internal Medicine

## 2017-04-12 ENCOUNTER — Encounter: Payer: No Typology Code available for payment source | Admitting: Internal Medicine

## 2017-04-15 ENCOUNTER — Other Ambulatory Visit: Payer: Self-pay | Admitting: Internal Medicine

## 2017-04-20 NOTE — Telephone Encounter (Signed)
Requesting refill Flexeril

## 2017-05-30 ENCOUNTER — Other Ambulatory Visit: Payer: Self-pay | Admitting: Physician Assistant

## 2017-05-30 ENCOUNTER — Encounter: Payer: Self-pay | Admitting: Internal Medicine

## 2017-05-31 ENCOUNTER — Other Ambulatory Visit: Payer: Self-pay

## 2017-05-31 MED ORDER — SIMVASTATIN 20 MG PO TABS: 20.0000 mg | ORAL_TABLET | Freq: Every day | ORAL | 5 refills | Status: DC

## 2017-05-31 MED ORDER — CYCLOBENZAPRINE HCL 10 MG PO TABS: 10.0000 mg | ORAL_TABLET | Freq: Every day | ORAL | 5 refills | Status: DC

## 2017-09-16 ENCOUNTER — Encounter: Payer: Self-pay | Admitting: Internal Medicine

## 2018-01-31 DIAGNOSIS — M545 Low back pain: Secondary | ICD-10-CM | POA: Diagnosis not present

## 2018-01-31 DIAGNOSIS — M9902 Segmental and somatic dysfunction of thoracic region: Secondary | ICD-10-CM | POA: Diagnosis not present

## 2018-01-31 DIAGNOSIS — M9904 Segmental and somatic dysfunction of sacral region: Secondary | ICD-10-CM | POA: Diagnosis not present

## 2018-01-31 DIAGNOSIS — M9907 Segmental and somatic dysfunction of upper extremity: Secondary | ICD-10-CM | POA: Diagnosis not present

## 2018-01-31 DIAGNOSIS — M9903 Segmental and somatic dysfunction of lumbar region: Secondary | ICD-10-CM | POA: Diagnosis not present

## 2018-02-03 ENCOUNTER — Encounter: Payer: Self-pay | Admitting: Family Medicine

## 2018-02-22 ENCOUNTER — Ambulatory Visit: Payer: Self-pay | Admitting: Internal Medicine

## 2018-03-15 DIAGNOSIS — M9904 Segmental and somatic dysfunction of sacral region: Secondary | ICD-10-CM | POA: Diagnosis not present

## 2018-03-15 DIAGNOSIS — M9902 Segmental and somatic dysfunction of thoracic region: Secondary | ICD-10-CM | POA: Diagnosis not present

## 2018-03-15 DIAGNOSIS — M9903 Segmental and somatic dysfunction of lumbar region: Secondary | ICD-10-CM | POA: Diagnosis not present

## 2018-03-15 DIAGNOSIS — M545 Low back pain: Secondary | ICD-10-CM | POA: Diagnosis not present

## 2018-04-26 DIAGNOSIS — M9903 Segmental and somatic dysfunction of lumbar region: Secondary | ICD-10-CM | POA: Diagnosis not present

## 2018-04-26 DIAGNOSIS — M9907 Segmental and somatic dysfunction of upper extremity: Secondary | ICD-10-CM | POA: Diagnosis not present

## 2018-04-26 DIAGNOSIS — M9902 Segmental and somatic dysfunction of thoracic region: Secondary | ICD-10-CM | POA: Diagnosis not present

## 2018-04-26 DIAGNOSIS — M9904 Segmental and somatic dysfunction of sacral region: Secondary | ICD-10-CM | POA: Diagnosis not present

## 2018-04-26 DIAGNOSIS — M545 Low back pain: Secondary | ICD-10-CM | POA: Diagnosis not present

## 2018-07-04 ENCOUNTER — Encounter: Payer: No Typology Code available for payment source | Admitting: Internal Medicine

## 2018-10-31 ENCOUNTER — Encounter: Payer: Self-pay | Admitting: Family Medicine

## 2018-10-31 ENCOUNTER — Ambulatory Visit (INDEPENDENT_AMBULATORY_CARE_PROVIDER_SITE_OTHER): Payer: BLUE CROSS/BLUE SHIELD | Admitting: Family Medicine

## 2018-10-31 VITALS — BP 132/86 | HR 83 | Temp 99.2°F | Ht 69.9 in | Wt 224.7 lb

## 2018-10-31 DIAGNOSIS — Z23 Encounter for immunization: Secondary | ICD-10-CM

## 2018-10-31 DIAGNOSIS — Z7689 Persons encountering health services in other specified circumstances: Secondary | ICD-10-CM

## 2018-10-31 DIAGNOSIS — R7303 Prediabetes: Secondary | ICD-10-CM | POA: Diagnosis not present

## 2018-10-31 DIAGNOSIS — M545 Low back pain, unspecified: Secondary | ICD-10-CM

## 2018-10-31 DIAGNOSIS — E7800 Pure hypercholesterolemia, unspecified: Secondary | ICD-10-CM

## 2018-10-31 MED ORDER — CYCLOBENZAPRINE HCL 10 MG PO TABS: 10.0000 mg | ORAL_TABLET | Freq: Every evening | ORAL | 0 refills | Status: DC | PRN

## 2018-10-31 MED ORDER — MELOXICAM 15 MG PO TABS: 15.0000 mg | ORAL_TABLET | Freq: Every day | ORAL | 0 refills | Status: DC

## 2018-10-31 NOTE — Progress Notes (Signed)
BP 132/86   Pulse 83   Temp 99.2 F (37.3 C) (Oral)   Ht 5' 9.9" (1.775 m)   Wt 224 lb 11.2 oz (101.9 kg)   SpO2 96%   BMI 32.33 kg/m    Subjective:    Patient ID: Kevin Cobb, male    DOB: Jan 04, 1971, 47 y.o.   MRN: [id number]  HPI: Kevin Cobb is a 47 y.o. male  Chief Complaint  Patient presents with  . Establish Care    no concerns   Wanting to get a CPE after the first of the year.  Long hx of low back pain. Used to see a chiropractor who told him he had disc degeneration - has not gone in years. Used to be on meloxicam and flexeril for it. Pain waxes and wanes depending on activity level.   History of hyperlipidemia previously on simvastatin and diet controlled prediabetes. Does not follow particular diet and does not regularly exercise.   Relevant past medical, surgical, family and social history reviewed and updated as indicated. Interim medical history since our last visit reviewed. Allergies and medications reviewed and updated.  Review of Systems  Per HPI unless specifically indicated above     Objective:    BP 132/86   Pulse 83   Temp 99.2 F (37.3 C) (Oral)   Ht 5' 9.9" (1.775 m)   Wt 224 lb 11.2 oz (101.9 kg)   SpO2 96%   BMI 32.33 kg/m   Wt Readings from Last 3 Encounters:  10/31/18 224 lb 11.2 oz (101.9 kg)  03/09/17 230 lb (104.3 kg)  06/25/16 226 lb (102.5 kg)    Physical Exam Vitals signs and nursing note reviewed.  Constitutional:      Appearance: Normal appearance.  HENT:     Head: Atraumatic.  Eyes:     Extraocular Movements: Extraocular movements intact.     Conjunctiva/sclera: Conjunctivae normal.     Pupils: Pupils are equal, round, and reactive to light.  Neck:     Musculoskeletal: Normal range of motion and neck supple.  Cardiovascular:     Rate and Rhythm: Normal rate and regular rhythm.     Pulses: Normal pulses.     Heart sounds: Normal heart sounds.  Pulmonary:     Effort: Pulmonary effort is normal.   Breath sounds: Normal breath sounds.  Musculoskeletal: Normal range of motion.  Skin:    General: Skin is warm and dry.  Neurological:     General: No focal deficit present.     Mental Status: He is alert and oriented to person, place, and time.  Psychiatric:        Mood and Affect: Mood normal.        Thought Content: Thought content normal.        Judgment: Judgment normal.     Results for orders placed or performed in visit on 03/09/17  CBC with Differential/Platelet  Result Value Ref Range   WBC 8.9 3.4 - 10.8 x10E3/uL   RBC 4.88 4.14 - 5.80 x10E6/uL   Hemoglobin 15.1 13.0 - 17.7 g/dL   Hematocrit 45.1 37.5 - 51.0 %   MCV 92 79 - 97 fL   MCH 30.9 26.6 - 33.0 pg   MCHC 33.5 31.5 - 35.7 g/dL   RDW 13.6 12.3 - 15.4 %   Platelets 225 150 - 379 x10E3/uL   Neutrophils 49 Not Estab. %   Lymphs 36 Not Estab. %   Monocytes 11 Not Estab. %  Eos 3 Not Estab. %   Basos 1 Not Estab. %   Neutrophils Absolute 4.4 1.4 - 7.0 x10E3/uL   Lymphocytes Absolute 3.2 (H) 0.7 - 3.1 x10E3/uL   Monocytes Absolute 1.0 (H) 0.1 - 0.9 x10E3/uL   EOS (ABSOLUTE) 0.2 0.0 - 0.4 x10E3/uL   Basophils Absolute 0.1 0.0 - 0.2 x10E3/uL   Immature Granulocytes 0 Not Estab. %   Immature Grans (Abs) 0.0 0.0 - 0.1 x10E3/uL  Comprehensive metabolic panel  Result Value Ref Range   Glucose 97 65 - 99 mg/dL   BUN 13 6 - 24 mg/dL   Creatinine, Ser 0.87 0.76 - 1.27 mg/dL   GFR calc non Af Amer 105 >59 mL/min/1.73   GFR calc Af Amer 121 >59 mL/min/1.73   BUN/Creatinine Ratio 15 9 - 20   Sodium 141 134 - 144 mmol/L   Potassium 4.1 3.5 - 5.2 mmol/L   Chloride 100 96 - 106 mmol/L   CO2 23 18 - 29 mmol/L   Calcium 9.0 8.7 - 10.2 mg/dL   Total Protein 7.3 6.0 - 8.5 g/dL   Albumin 4.9 3.5 - 5.5 g/dL   Globulin, Total 2.4 1.5 - 4.5 g/dL   Albumin/Globulin Ratio 2.0 1.2 - 2.2   Bilirubin Total 0.6 0.0 - 1.2 mg/dL   Alkaline Phosphatase 57 39 - 117 IU/L   AST 22 0 - 40 IU/L   ALT 37 0 - 44 IU/L  Lipid panel    Result Value Ref Range   Cholesterol, Total 155 100 - 199 mg/dL   Triglycerides 96 0 - 149 mg/dL   HDL 35 (L) >39 mg/dL   VLDL Cholesterol Cal 19 5 - 40 mg/dL   LDL Calculated 101 (H) 0 - 99 mg/dL   Chol/HDL Ratio 4.4 0.0 - 5.0 ratio  Hemoglobin A1c  Result Value Ref Range   Hgb A1c MFr Bld 5.8 (H) 4.8 - 5.6 %   Est. average glucose Bld gHb Est-mCnc 120 mg/dL  POCT urinalysis dipstick  Result Value Ref Range   Color, UA orange    Clarity, UA clear    Glucose, UA neg    Bilirubin, UA neg    Ketones, UA neg    Spec Grav, UA 1.015 1.010 - 1.025   Blood, UA neg    pH, UA 6.5 5.0 - 8.0   Protein, UA trace    Urobilinogen, UA 0.2 0.2 or 1.0 E.U./dL   Nitrite, UA neg    Leukocytes, UA Negative Negative      Assessment & Plan:   Problem List Items Addressed This Visit      Other   Hypercholesteremia - Primary    Will check lipids at upcoming CPE and restart statins as needed      Pre-diabetes    Check A1C at upcoming visit. Discussed lifestyle modifications      Back pain, lumbosacral    Restart meloxicam and flexeril. Lidocaine patches, massage.       Relevant Medications   meloxicam (MOBIC) 15 MG tablet   cyclobenzaprine (FLEXERIL) 10 MG tablet    Other Visit Diagnoses    Encounter to establish care       Need for influenza vaccination       Relevant Orders   Flu Vaccine QUAD 36+ mos IM (Completed)       Follow up plan: Return for CPE.

## 2018-10-31 NOTE — Patient Instructions (Signed)

## 2018-11-03 NOTE — Assessment & Plan Note (Signed)
Restart meloxicam and flexeril. Lidocaine patches, massage.

## 2018-11-03 NOTE — Assessment & Plan Note (Signed)
Check A1C at upcoming visit. Discussed lifestyle modifications

## 2018-11-03 NOTE — Assessment & Plan Note (Signed)
Will check lipids at upcoming CPE and restart statins as needed

## 2018-12-02 ENCOUNTER — Ambulatory Visit (INDEPENDENT_AMBULATORY_CARE_PROVIDER_SITE_OTHER): Payer: BLUE CROSS/BLUE SHIELD | Admitting: Family Medicine

## 2018-12-02 ENCOUNTER — Encounter: Payer: Self-pay | Admitting: Family Medicine

## 2018-12-02 VITALS — BP 123/80 | HR 59 | Temp 97.9°F | Wt 222.6 lb

## 2018-12-02 DIAGNOSIS — Z0000 Encounter for general adult medical examination without abnormal findings: Secondary | ICD-10-CM | POA: Diagnosis not present

## 2018-12-02 DIAGNOSIS — R7303 Prediabetes: Secondary | ICD-10-CM | POA: Diagnosis not present

## 2018-12-02 DIAGNOSIS — Z114 Encounter for screening for human immunodeficiency virus [HIV]: Secondary | ICD-10-CM

## 2018-12-02 DIAGNOSIS — E7800 Pure hypercholesterolemia, unspecified: Secondary | ICD-10-CM

## 2018-12-02 LAB — UA/M W/RFLX CULTURE, ROUTINE
Bilirubin, UA: NEGATIVE
Glucose, UA: NEGATIVE
Ketones, UA: NEGATIVE
Leukocytes, UA: NEGATIVE
Nitrite, UA: NEGATIVE
Protein, UA: NEGATIVE
RBC, UA: NEGATIVE
Specific Gravity, UA: >1.03 — ABNORMAL HIGH (ref 1.005–1.030)
Urobilinogen, Ur: 0.2 mg/dL (ref 0.2–1.0)
pH, UA: 6 (ref 5.0–7.5)

## 2018-12-02 MED ORDER — MELOXICAM 15 MG PO TABS: 15.0000 mg | ORAL_TABLET | Freq: Every day | ORAL | 3 refills | Status: DC

## 2018-12-02 MED ORDER — CYCLOBENZAPRINE HCL 10 MG PO TABS: 10.0000 mg | ORAL_TABLET | Freq: Every evening | ORAL | 3 refills | Status: DC | PRN

## 2018-12-02 NOTE — Assessment & Plan Note (Signed)
Diet controlled, recheck lipids and continue good lifestyle modifications

## 2018-12-02 NOTE — Assessment & Plan Note (Signed)
Diet controlled.  

## 2018-12-02 NOTE — Progress Notes (Signed)
BP 123/80   Pulse (!) 59   Temp 97.9 F (36.6 C) (Oral)   Wt 222 lb 9.6 oz (101 kg)   SpO2 96%   BMI 32.03 kg/m    Subjective:    Patient ID: Kevin Cobb, male    DOB: [birth year], 48 y.o.   MRN: [id number]  HPI: Kevin Cobb is a 48 y.o. male presenting on 12/02/2018 for comprehensive medical examination. Current medical complaints include:none  He currently lives with: Interim Problems from his last visit: no  Depression Screen done today and results listed below:  Depression screen [facility] 2/9 10/31/2018 03/09/2017 04/08/2016  Decreased Interest 0 0 0  Down, Depressed, Hopeless 0 0 0  PHQ - 2 Score 0 0 0  Altered sleeping 0 - -  Tired, decreased energy 0 - -  Change in appetite 1 - -  Feeling bad or failure about yourself  0 - -  Trouble concentrating 0 - -  Moving slowly or fidgety/restless 0 - -  Suicidal thoughts 0 - -  PHQ-9 Score 1 - -    The patient does not have a history of falls. I did not complete a risk assessment for falls. A plan of care for falls was not documented.   Past Medical History:  Past Medical History:  Diagnosis Date  . Back pain   . Cataract   . Hyperlipidemia   . Tennis elbow     Surgical History:  Past Surgical History:  Procedure Laterality Date  . CATARACT EXTRACTION    . FRACTURE SURGERY    . LEG SURGERY Right    childhood    Medications:  No current outpatient medications on file prior to visit.   No current facility-administered medications on file prior to visit.     Allergies:  No Known Allergies  Social History:  Social History   Socioeconomic History  . Marital status: Married    Spouse name: Not on file  . Number of children: Not on file  . Years of education: Not on file  . Highest education level: Not on file  Occupational History  . Not on file  Social Needs  . Financial resource strain: Not on file  . Food insecurity:    Worry: Not on file    Inability: Not on file  . Transportation needs:      Medical: Not on file    Non-medical: Not on file  Tobacco Use  . Smoking status: Current Every Day Smoker    Packs/day: 1.00    Years: 25.00    Pack years: 25.00    Types: Cigarettes  . Smokeless tobacco: Never Used  Substance and Sexual Activity  . Alcohol use: No    Alcohol/week: 0.0 standard drinks  . Drug use: No  . Sexual activity: Yes  Lifestyle  . Physical activity:    Days per week: Not on file    Minutes per session: Not on file  . Stress: Not on file  Relationships  . Social connections:    Talks on phone: Not on file    Gets together: Not on file    Attends religious service: Not on file    Active member of club or organization: Not on file    Attends meetings of clubs or organizations: Not on file    Relationship status: Not on file  . Intimate partner violence:    Fear of current or ex partner: Not on file    Emotionally abused: Not  on file    Physically abused: Not on file    Forced sexual activity: Not on file  Other Topics Concern  . Not on file  Social History Narrative  . Not on file   Social History   Tobacco Use  Smoking Status Current Every Day Smoker  . Packs/day: 1.00  . Years: 25.00  . Pack years: 25.00  . Types: Cigarettes  Smokeless Tobacco Never Used   Social History   Substance and Sexual Activity  Alcohol Use No  . Alcohol/week: 0.0 standard drinks    Family History:  Family History  Problem Relation Age of Onset  . Diabetes Mother   . Kidney disease Mother   . Hyperlipidemia Mother   . Hypertension Mother   . Brain cancer Father   . Lung cancer Father   . Deep vein thrombosis Sister   . Factor V Leiden deficiency Sister   . Lung cancer Paternal Aunt   . Heart disease Maternal Grandmother   . Diabetes Maternal Grandmother   . Alzheimer's disease Paternal Grandmother   . Melanoma Paternal Grandmother     Past medical history, surgical history, medications, allergies, family history and social history reviewed with  patient today and changes made to appropriate areas of the chart.   Review of Systems - General ROS: negative Psychological ROS: negative Ophthalmic ROS: negative ENT ROS: negative Allergy and Immunology ROS: negative Hematological and Lymphatic ROS: negative Endocrine ROS: negative Breast ROS: negative for breast lumps Respiratory ROS: no cough, shortness of breath, or wheezing Cardiovascular ROS: no chest pain or dyspnea on exertion Gastrointestinal ROS: no abdominal pain, change in bowel habits, or black or bloody stools Genito-Urinary ROS: no dysuria, trouble voiding, or hematuria Musculoskeletal ROS: negative Neurological ROS: no TIA or stroke symptoms Dermatological ROS: negative All other ROS negative except what is listed above and in the HPI.      Objective:    BP 123/80   Pulse (!) 59   Temp 97.9 F (36.6 C) (Oral)   Wt 222 lb 9.6 oz (101 kg)   SpO2 96%   BMI 32.03 kg/m   Wt Readings from Last 3 Encounters:  12/02/18 222 lb 9.6 oz (101 kg)  10/31/18 224 lb 11.2 oz (101.9 kg)  03/09/17 230 lb (104.3 kg)    Physical Exam Vitals signs and nursing note reviewed.  Constitutional:      General: He is not in acute distress.    Appearance: Normal appearance. He is well-developed.  HENT:     Head: Atraumatic.     Right Ear: Tympanic membrane and external ear normal.     Left Ear: Tympanic membrane and external ear normal.     Nose: Nose normal.     Mouth/Throat:     Mouth: Mucous membranes are moist.     Pharynx: Oropharynx is clear. No posterior oropharyngeal erythema.  Eyes:     General: No scleral icterus.    Conjunctiva/sclera: Conjunctivae normal.     Pupils: Pupils are equal, round, and reactive to light.  Neck:     Musculoskeletal: Normal range of motion and neck supple.  Cardiovascular:     Rate and Rhythm: Normal rate and regular rhythm.     Heart sounds: Normal heart sounds. No murmur.  Pulmonary:     Effort: Pulmonary effort is normal. No  respiratory distress.     Breath sounds: Normal breath sounds.  Abdominal:     General: Bowel sounds are normal. There is no distension.  Palpations: Abdomen is soft. There is no mass.     Tenderness: There is no abdominal tenderness. There is no guarding.  Musculoskeletal: Normal range of motion.        General: No tenderness.  Skin:    General: Skin is warm and dry.     Findings: No rash.  Neurological:     General: No focal deficit present.     Mental Status: He is alert and oriented to person, place, and time.     Deep Tendon Reflexes: Reflexes are normal and symmetric.  Psychiatric:        Mood and Affect: Mood normal.        Behavior: Behavior normal.        Thought Content: Thought content normal.        Judgment: Judgment normal.     Results for orders placed or performed in visit on 03/09/17  CBC with Differential/Platelet  Result Value Ref Range   WBC 8.9 3.4 - 10.8 x10E3/uL   RBC 4.88 4.14 - 5.80 x10E6/uL   Hemoglobin 15.1 13.0 - 17.7 g/dL   Hematocrit 45.1 37.5 - 51.0 %   MCV 92 79 - 97 fL   MCH 30.9 26.6 - 33.0 pg   MCHC 33.5 31.5 - 35.7 g/dL   RDW 13.6 12.3 - 15.4 %   Platelets 225 150 - 379 x10E3/uL   Neutrophils 49 Not Estab. %   Lymphs 36 Not Estab. %   Monocytes 11 Not Estab. %   Eos 3 Not Estab. %   Basos 1 Not Estab. %   Neutrophils Absolute 4.4 1.4 - 7.0 x10E3/uL   Lymphocytes Absolute 3.2 (H) 0.7 - 3.1 x10E3/uL   Monocytes Absolute 1.0 (H) 0.1 - 0.9 x10E3/uL   EOS (ABSOLUTE) 0.2 0.0 - 0.4 x10E3/uL   Basophils Absolute 0.1 0.0 - 0.2 x10E3/uL   Immature Granulocytes 0 Not Estab. %   Immature Grans (Abs) 0.0 0.0 - 0.1 x10E3/uL  Comprehensive metabolic panel  Result Value Ref Range   Glucose 97 65 - 99 mg/dL   BUN 13 6 - 24 mg/dL   Creatinine, Ser 0.87 0.76 - 1.27 mg/dL   GFR calc non Af Amer 105 >59 mL/min/1.73   GFR calc Af Amer 121 >59 mL/min/1.73   BUN/Creatinine Ratio 15 9 - 20   Sodium 141 134 - 144 mmol/L   Potassium 4.1 3.5 - 5.2  mmol/L   Chloride 100 96 - 106 mmol/L   CO2 23 18 - 29 mmol/L   Calcium 9.0 8.7 - 10.2 mg/dL   Total Protein 7.3 6.0 - 8.5 g/dL   Albumin 4.9 3.5 - 5.5 g/dL   Globulin, Total 2.4 1.5 - 4.5 g/dL   Albumin/Globulin Ratio 2.0 1.2 - 2.2   Bilirubin Total 0.6 0.0 - 1.2 mg/dL   Alkaline Phosphatase 57 39 - 117 IU/L   AST 22 0 - 40 IU/L   ALT 37 0 - 44 IU/L  Lipid panel  Result Value Ref Range   Cholesterol, Total 155 100 - 199 mg/dL   Triglycerides 96 0 - 149 mg/dL   HDL 35 (L) >39 mg/dL   VLDL Cholesterol Cal 19 5 - 40 mg/dL   LDL Calculated 101 (H) 0 - 99 mg/dL   Chol/HDL Ratio 4.4 0.0 - 5.0 ratio  Hemoglobin A1c  Result Value Ref Range   Hgb A1c MFr Bld 5.8 (H) 4.8 - 5.6 %   Est. average glucose Bld gHb Est-mCnc 120 mg/dL  POCT urinalysis  dipstick  Result Value Ref Range   Color, UA orange    Clarity, UA clear    Glucose, UA neg    Bilirubin, UA neg    Ketones, UA neg    Spec Grav, UA 1.015 1.010 - 1.025   Blood, UA neg    pH, UA 6.5 5.0 - 8.0   Protein, UA trace    Urobilinogen, UA 0.2 0.2 or 1.0 E.U./dL   Nitrite, UA neg    Leukocytes, UA Negative Negative      Assessment & Plan:   Problem List Items Addressed This Visit      Other   Hypercholesteremia    Diet controlled, recheck lipids and continue good lifestyle modifications      Relevant Orders   Comprehensive metabolic panel   Lipid Panel w/o Chol/HDL Ratio   Pre-diabetes    Diet controlled      Relevant Orders   HgB A1c    Other Visit Diagnoses    Annual physical exam    -  Primary   Relevant Orders   CBC with Differential/Platelet   UA/M w/rflx Culture, Routine   Screening for HIV (human immunodeficiency virus)       Relevant Orders   HIV Antibody (routine testing w rflx)       Discussed aspirin prophylaxis for myocardial infarction prevention and decision was it was not indicated  LABORATORY TESTING:  Health maintenance labs ordered today as discussed above.   The natural history of  prostate cancer and ongoing controversy regarding screening and potential treatment outcomes of prostate cancer has been discussed with the patient. The meaning of a false positive PSA and a false negative PSA has been discussed. He indicates understanding of the limitations of this screening test and wishes not to proceed with screening PSA testing.   IMMUNIZATIONS:   - Tdap: Tetanus vaccination status reviewed: last tetanus booster within 10 years. - Influenza: Up to date  PATIENT COUNSELING:    Sexuality: Discussed sexually transmitted diseases, partner selection, use of condoms, avoidance of unintended pregnancy  and contraceptive alternatives.   Advised to avoid cigarette smoking.  I discussed with the patient that most people either abstain from alcohol or drink within safe limits (<=14/week and <=4 drinks/occasion for males, <=7/weeks and <= 3 drinks/occasion for females) and that the risk for alcohol disorders and other health effects rises proportionally with the number of drinks per week and how often a drinker exceeds daily limits.  Discussed cessation/primary prevention of drug use and availability of treatment for abuse.   Diet: Encouraged to adjust caloric intake to maintain  or achieve ideal body weight, to reduce intake of dietary saturated fat and total fat, to limit sodium intake by avoiding high sodium foods and not adding table salt, and to maintain adequate dietary potassium and calcium preferably from fresh fruits, vegetables, and low-fat dairy products.    stressed the importance of regular exercise  Injury prevention: Discussed safety belts, safety helmets, smoke detector, smoking near bedding or upholstery.   Dental health: Discussed importance of regular tooth brushing, flossing, and dental visits.   Follow up plan: NEXT PREVENTATIVE PHYSICAL DUE IN 1 YEAR. Return in about 1 year (around 12/03/2019) for CPE.

## 2018-12-03 LAB — CBC WITH DIFFERENTIAL/PLATELET
Basophils Absolute: 0.1 10*3/uL (ref 0.0–0.2)
Basos: 1 %
EOS (ABSOLUTE): 0.2 10*3/uL (ref 0.0–0.4)
Eos: 3 %
Hematocrit: 44.2 % (ref 37.5–51.0)
Hemoglobin: 14.6 g/dL (ref 13.0–17.7)
Immature Grans (Abs): 0 10*3/uL (ref 0.0–0.1)
Immature Granulocytes: 0 %
LYMPHS: 39 %
Lymphocytes Absolute: 3.2 10*3/uL — ABNORMAL HIGH (ref 0.7–3.1)
MCH: 30.2 pg (ref 26.6–33.0)
MCHC: 33 g/dL (ref 31.5–35.7)
MCV: 91 fL (ref 79–97)
Monocytes Absolute: 0.7 10*3/uL (ref 0.1–0.9)
Monocytes: 9 %
Neutrophils Absolute: 4.1 10*3/uL (ref 1.4–7.0)
Neutrophils: 48 %
Platelets: 243 10*3/uL (ref 150–450)
RBC: 4.84 x10E6/uL (ref 4.14–5.80)
RDW: 12.7 % (ref 11.6–15.4)
WBC: 8.4 10*3/uL (ref 3.4–10.8)

## 2018-12-03 LAB — COMPREHENSIVE METABOLIC PANEL
ALT: 29 IU/L (ref 0–44)
AST: 17 IU/L (ref 0–40)
Albumin/Globulin Ratio: 1.8 (ref 1.2–2.2)
Albumin: 4.7 g/dL (ref 3.5–5.5)
Alkaline Phosphatase: 47 IU/L (ref 39–117)
BILIRUBIN TOTAL: 0.5 mg/dL (ref 0.0–1.2)
BUN / CREAT RATIO: 16 (ref 9–20)
BUN: 15 mg/dL (ref 6–24)
CO2: 22 mmol/L (ref 20–29)
Calcium: 9.3 mg/dL (ref 8.7–10.2)
Chloride: 102 mmol/L (ref 96–106)
Creatinine, Ser: 0.94 mg/dL (ref 0.76–1.27)
GFR calc Af Amer: 113 mL/min/{1.73_m2} (ref >59)
GFR calc non Af Amer: 98 mL/min/{1.73_m2} (ref >59)
Globulin, Total: 2.6 g/dL (ref 1.5–4.5)
Glucose: 92 mg/dL (ref 65–99)
Potassium: 4.5 mmol/L (ref 3.5–5.2)
Sodium: 142 mmol/L (ref 134–144)
Total Protein: 7.3 g/dL (ref 6.0–8.5)

## 2018-12-03 LAB — LIPID PANEL W/O CHOL/HDL RATIO
Cholesterol, Total: 230 mg/dL — ABNORMAL HIGH (ref 100–199)
HDL: 37 mg/dL — ABNORMAL LOW (ref >39)
LDL Calculated: 177 mg/dL — ABNORMAL HIGH (ref 0–99)
Triglycerides: 82 mg/dL (ref 0–149)
VLDL Cholesterol Cal: 16 mg/dL (ref 5–40)

## 2018-12-03 LAB — HEMOGLOBIN A1C
Est. average glucose Bld gHb Est-mCnc: 120 mg/dL
Hgb A1c MFr Bld: 5.8 % — ABNORMAL HIGH (ref 4.8–5.6)

## 2018-12-03 LAB — HIV ANTIBODY (ROUTINE TESTING W REFLEX): HIV SCREEN 4TH GENERATION: NONREACTIVE

## 2019-04-30 ENCOUNTER — Other Ambulatory Visit: Payer: Self-pay | Admitting: Family Medicine

## 2019-05-02 ENCOUNTER — Encounter: Payer: Self-pay | Admitting: Family Medicine

## 2019-05-02 ENCOUNTER — Other Ambulatory Visit: Payer: Self-pay | Admitting: Family Medicine

## 2019-05-02 MED ORDER — CYCLOBENZAPRINE HCL 10 MG PO TABS: 10.0000 mg | ORAL_TABLET | Freq: Every evening | ORAL | 3 refills | Status: DC | PRN

## 2019-05-02 NOTE — Telephone Encounter (Signed)
Please see pt's response. Needs RXs sent to mail order RX.

## 2019-05-03 MED ORDER — MELOXICAM 15 MG PO TABS: 15.0000 mg | ORAL_TABLET | Freq: Every day | ORAL | 3 refills | Status: DC

## 2019-05-03 MED ORDER — CYCLOBENZAPRINE HCL 10 MG PO TABS: 10.0000 mg | ORAL_TABLET | Freq: Every evening | ORAL | 3 refills | Status: DC | PRN

## 2019-05-09 ENCOUNTER — Encounter: Payer: Self-pay | Admitting: Family Medicine

## 2019-05-17 ENCOUNTER — Ambulatory Visit: Payer: BLUE CROSS/BLUE SHIELD | Admitting: Family Medicine

## 2019-12-05 ENCOUNTER — Encounter: Payer: BLUE CROSS/BLUE SHIELD | Admitting: Family Medicine

## 2019-12-13 ENCOUNTER — Ambulatory Visit (INDEPENDENT_AMBULATORY_CARE_PROVIDER_SITE_OTHER): Payer: BC Managed Care – PPO | Admitting: Family Medicine

## 2019-12-13 ENCOUNTER — Other Ambulatory Visit: Payer: Self-pay

## 2019-12-13 ENCOUNTER — Ambulatory Visit: Payer: BC Managed Care – PPO | Attending: Internal Medicine

## 2019-12-13 ENCOUNTER — Encounter: Payer: Self-pay | Admitting: Family Medicine

## 2019-12-13 VITALS — BP 138/90 | HR 99 | Temp 98.7°F | Ht 71.0 in | Wt 230.0 lb

## 2019-12-13 DIAGNOSIS — R1013 Epigastric pain: Secondary | ICD-10-CM | POA: Diagnosis not present

## 2019-12-13 DIAGNOSIS — R519 Headache, unspecified: Secondary | ICD-10-CM | POA: Diagnosis not present

## 2019-12-13 DIAGNOSIS — R0602 Shortness of breath: Secondary | ICD-10-CM

## 2019-12-13 DIAGNOSIS — Z20822 Contact with and (suspected) exposure to covid-19: Secondary | ICD-10-CM

## 2019-12-13 NOTE — Progress Notes (Signed)
BP 138/90   Pulse 99   Temp 98.7 F (37.1 C) (Oral)   Ht 5' 11"  (1.803 m)   Wt 230 lb (104.3 kg)   SpO2 95%   BMI 32.08 kg/m    Subjective:    Patient ID: Kevin Cobb, male    DOB: May 21, 1971, 49 y.o.   MRN: [id number]  HPI: Kevin Cobb is a 49 y.o. male  Chief Complaint  Patient presents with  . Shortness of Breath    since last night  . Gastroesophageal Reflux    x 2 days    . This visit was completed via WebEx due to the restrictions of the COVID-19 pandemic. All issues as above were discussed and addressed. Physical exam was done as above through visual confirmation on WebEx. If it was felt that the patient should be evaluated in the office, they were directed there. The patient verbally consented to this visit. . Location of the patient: home . Location of the provider: work . Those involved with this call:  . Provider: Merrie Roof, PA-C . CMA: Lesle Chris, [place] . Front Desk/Registration: Jill Side  . Time spent on call: 15 minutes with patient face to face via video conference. More than 50% of this time was spent in counseling and coordination of care. 5 minutes total spent in review of patient's record and preparation of their chart. I verified patient identity using two factors (patient name and date of birth). Patient consents verbally to being seen via telemedicine visit today.   Not feeling well the past few days, epigastric/lower chest pain that he believes to be reflux related. Also had some minor headaches, SOB while at work last night though states it's very hot in his work environment so that isn't abnormal. Feeling almost completely better today. Took some reflux medication which did help. Denies congestion, sore throat, inability to tolerate PO, fever, chills, body aches.   Relevant past medical, surgical, family and social history reviewed and updated as indicated. Interim medical history since our last visit reviewed. Allergies and  medications reviewed and updated.  Review of Systems  Per HPI unless specifically indicated above     Objective:    BP 138/90   Pulse 99   Temp 98.7 F (37.1 C) (Oral)   Ht 5' 11"  (1.803 m)   Wt 230 lb (104.3 kg)   SpO2 95%   BMI 32.08 kg/m   Wt Readings from Last 3 Encounters:  12/13/19 230 lb (104.3 kg)  12/02/18 222 lb 9.6 oz (101 kg)  10/31/18 224 lb 11.2 oz (101.9 kg)    Physical Exam Vitals and nursing note reviewed.  Constitutional:      General: He is not in acute distress.    Appearance: Normal appearance.  HENT:     Head: Atraumatic.     Right Ear: External ear normal.     Left Ear: External ear normal.     Nose: Nose normal. No congestion.     Mouth/Throat:     Mouth: Mucous membranes are moist.     Pharynx: Oropharynx is clear.  Eyes:     Extraocular Movements: Extraocular movements intact.     Conjunctiva/sclera: Conjunctivae normal.  Pulmonary:     Effort: Pulmonary effort is normal. No respiratory distress.  Abdominal:     Comments: Unable to perform abdominal exam due to virtual nature of visit  Musculoskeletal:        General: Normal range of motion.  Cervical back: Normal range of motion.  Skin:    General: Skin is dry.     Findings: No erythema or rash.  Neurological:     Mental Status: He is oriented to person, place, and time.  Psychiatric:        Mood and Affect: Mood normal.        Thought Content: Thought content normal.        Judgment: Judgment normal.     Results for orders placed or performed in visit on 12/02/18  CBC with Differential/Platelet  Result Value Ref Range   WBC 8.4 3.4 - 10.8 x10E3/uL   RBC 4.84 4.14 - 5.80 x10E6/uL   Hemoglobin 14.6 13.0 - 17.7 g/dL   Hematocrit 44.2 37.5 - 51.0 %   MCV 91 79 - 97 fL   MCH 30.2 26.6 - 33.0 pg   MCHC 33.0 31.5 - 35.7 g/dL   RDW 12.7 11.6 - 15.4 %   Platelets 243 150 - 450 x10E3/uL   Neutrophils 48 Not Estab. %   Lymphs 39 Not Estab. %   Monocytes 9 Not Estab. %   Eos  3 Not Estab. %   Basos 1 Not Estab. %   Neutrophils Absolute 4.1 1.4 - 7.0 x10E3/uL   Lymphocytes Absolute 3.2 (H) 0.7 - 3.1 x10E3/uL   Monocytes Absolute 0.7 0.1 - 0.9 x10E3/uL   EOS (ABSOLUTE) 0.2 0.0 - 0.4 x10E3/uL   Basophils Absolute 0.1 0.0 - 0.2 x10E3/uL   Immature Granulocytes 0 Not Estab. %   Immature Grans (Abs) 0.0 0.0 - 0.1 x10E3/uL  Comprehensive metabolic panel  Result Value Ref Range   Glucose 92 65 - 99 mg/dL   BUN 15 6 - 24 mg/dL   Creatinine, Ser 0.94 0.76 - 1.27 mg/dL   GFR calc non Af Amer 98 >59 mL/min/1.73   GFR calc Af Amer 113 >59 mL/min/1.73   BUN/Creatinine Ratio 16 9 - 20   Sodium 142 134 - 144 mmol/L   Potassium 4.5 3.5 - 5.2 mmol/L   Chloride 102 96 - 106 mmol/L   CO2 22 20 - 29 mmol/L   Calcium 9.3 8.7 - 10.2 mg/dL   Total Protein 7.3 6.0 - 8.5 g/dL   Albumin 4.7 3.5 - 5.5 g/dL   Globulin, Total 2.6 1.5 - 4.5 g/dL   Albumin/Globulin Ratio 1.8 1.2 - 2.2   Bilirubin Total 0.5 0.0 - 1.2 mg/dL   Alkaline Phosphatase 47 39 - 117 IU/L   AST 17 0 - 40 IU/L   ALT 29 0 - 44 IU/L  Lipid Panel w/o Chol/HDL Ratio  Result Value Ref Range   Cholesterol, Total 230 (H) 100 - 199 mg/dL   Triglycerides 82 0 - 149 mg/dL   HDL 37 (L) >39 mg/dL   VLDL Cholesterol Cal 16 5 - 40 mg/dL   LDL Calculated 177 (H) 0 - 99 mg/dL  UA/M w/rflx Culture, Routine   Specimen: Urine   URINE  Result Value Ref Range   Specific Gravity, UA >1.030 (H) 1.005 - 1.030   pH, UA 6.0 5.0 - 7.5   Color, UA Yellow Yellow   Appearance Ur Clear Clear   Leukocytes, UA Negative Negative   Protein, UA Negative Negative/Trace   Glucose, UA Negative Negative   Ketones, UA Negative Negative   RBC, UA Negative Negative   Bilirubin, UA Negative Negative   Urobilinogen, Ur 0.2 0.2 - 1.0 mg/dL   Nitrite, UA Negative Negative  HgB A1c  Result Value  Ref Range   Hgb A1c MFr Bld 5.8 (H) 4.8 - 5.6 %   Est. average glucose Bld gHb Est-mCnc 120 mg/dL  HIV Antibody (routine testing w rflx)  Result  Value Ref Range   HIV Screen 4th Generation wRfx Non Reactive Non Reactive      Assessment & Plan:   Problem List Items Addressed This Visit    None    Visit Diagnoses    Acute nonintractable headache, unspecified headache type    -  Primary   Resolving, will test for COVID and isolate until results back. Supportive care and return precautions given   SOB (shortness of breath)       Await COVID results, sxs have resolved. F/u if recurring or worsening   Epigastric pain       Improved with PPI, continue prn use and BRAT diet. F/u for in person eval if recurring       Follow up plan: Return if symptoms worsen or fail to improve.

## 2019-12-14 LAB — NOVEL CORONAVIRUS, NAA: SARS-CoV-2, NAA: NOT DETECTED

## 2020-01-17 ENCOUNTER — Encounter: Payer: Self-pay | Admitting: Family Medicine

## 2020-01-17 ENCOUNTER — Other Ambulatory Visit: Payer: Self-pay

## 2020-01-17 ENCOUNTER — Ambulatory Visit (INDEPENDENT_AMBULATORY_CARE_PROVIDER_SITE_OTHER): Payer: BC Managed Care – PPO | Admitting: Family Medicine

## 2020-01-17 VITALS — BP 124/68 | HR 80 | Temp 97.9°F | Ht 70.5 in | Wt 233.0 lb

## 2020-01-17 DIAGNOSIS — Z0000 Encounter for general adult medical examination without abnormal findings: Secondary | ICD-10-CM | POA: Diagnosis not present

## 2020-01-17 DIAGNOSIS — R7303 Prediabetes: Secondary | ICD-10-CM

## 2020-01-17 DIAGNOSIS — E7800 Pure hypercholesterolemia, unspecified: Secondary | ICD-10-CM | POA: Diagnosis not present

## 2020-01-17 DIAGNOSIS — M545 Low back pain, unspecified: Secondary | ICD-10-CM

## 2020-01-17 DIAGNOSIS — Z23 Encounter for immunization: Secondary | ICD-10-CM | POA: Diagnosis not present

## 2020-01-17 LAB — UA/M W/RFLX CULTURE, ROUTINE
Bilirubin, UA: NEGATIVE
Glucose, UA: NEGATIVE
Ketones, UA: NEGATIVE
Leukocytes,UA: NEGATIVE
Nitrite, UA: NEGATIVE
RBC, UA: NEGATIVE
Specific Gravity, UA: 1.02 (ref 1.005–1.030)
Urobilinogen, Ur: 1 mg/dL (ref 0.2–1.0)
pH, UA: 7 (ref 5.0–7.5)

## 2020-01-17 LAB — MICROSCOPIC EXAMINATION
Bacteria, UA: NONE SEEN
RBC, Urine: NONE SEEN /hpf (ref 0–2)
WBC, UA: NONE SEEN /hpf (ref 0–5)

## 2020-01-17 MED ORDER — MELOXICAM 15 MG PO TABS: 15.0000 mg | ORAL_TABLET | Freq: Every day | ORAL | 1 refills | Status: DC

## 2020-01-17 MED ORDER — CYCLOBENZAPRINE HCL 10 MG PO TABS: 10.0000 mg | ORAL_TABLET | Freq: Every evening | ORAL | 1 refills | Status: DC | PRN

## 2020-01-17 NOTE — Progress Notes (Signed)
BP 124/68   Pulse 80   Temp 97.9 F (36.6 C) (Oral)   Ht 5' 10.5" (1.791 m)   Wt 233 lb (105.7 kg)   SpO2 96%   BMI 32.96 kg/m    Subjective:    Patient ID: Kevin Cobb, male    DOB: 03-22-1971, 49 y.o.   MRN: [id number]  HPI: Kevin Cobb is a 49 y.o. male presenting on 01/17/2020 for comprehensive medical examination. Current medical complaints include:see below  B/l low back pain without sciatica, chronic. 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 Used to do well on the meloxicam and flexeril but lately they haven't been as effective. Stands on concrete all day at work. Within an hour now his back is hurting him when he's working. 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 Using biofeeze and heat with mild temporary relief sometimes too.   IFG and HLD - diet controlled. Does not follow strict regimen or exercise but stays active with work. Denies sxs or concerns.   He currently lives with: Interim Problems from his last visit: no  Depression Screen done today and results listed below:  Depression screen [facility] 2/9 10/31/2018 03/09/2017 04/08/2016  Decreased Interest 0 0 0  Down, Depressed, Hopeless 0 0 0  PHQ - 2 Score 0 0 0  Altered sleeping 0 - -  Tired, decreased energy 0 - -  Change in appetite 1 - -  Feeling bad or failure about yourself  0 - -  Trouble concentrating 0 - -  Moving slowly or fidgety/restless 0 - -  Suicidal thoughts 0 - -  PHQ-9 Score 1 - -    The patient does not have a history of falls. I did complete a risk assessment for falls. A plan of care for falls was documented.   Past Medical History:  Past Medical History:  Diagnosis Date  . Back pain   . Cataract   . Hyperlipidemia   . Tennis elbow     Surgical History:  Past Surgical History:  Procedure Laterality Date  . CATARACT EXTRACTION    . FRACTURE SURGERY    . LEG SURGERY Right    childhood    Medications:  No  current outpatient medications on file prior to visit.   No current facility-administered medications on file prior to visit.    Allergies:  No Known Allergies  Social History:  Social History   Socioeconomic History  . Marital status: Married    Spouse name: Not on file  . Number of children: Not on file  . Years of education: Not on file  . Highest education level: Not on file  Occupational History  . Not on file  Tobacco Use  . Smoking status: Current Every Day Smoker    Packs/day: 1.00    Years: 25.00    Pack years: 25.00    Types: Cigarettes  . Smokeless tobacco: Never Used  Substance and Sexual Activity  . Alcohol use: No    Alcohol/week: 0.0 standard drinks  . Drug use: No  . Sexual activity: Yes  Other Topics Concern  . Not on file  Social History Narrative  . Not on file   Social Determinants of Health   Financial Resource Strain:   . Difficulty of Paying Living Expenses: Not on file  Food Insecurity:   . Worried About Charity fundraiser in the Last Year: Not on file  .  Ran Out of Food in the Last Year: Not on file  Transportation Needs:   . Lack of Transportation (Medical): Not on file  . Lack of Transportation (Non-Medical): Not on file  Physical Activity:   . Days of Exercise per Week: Not on file  . Minutes of Exercise per Session: Not on file  Stress:   . Feeling of Stress : Not on file  Social Connections:   . Frequency of Communication with Friends and Family: Not on file  . Frequency of Social Gatherings with Friends and Family: Not on file  . Attends Religious Services: Not on file  . Active Member of Clubs or Organizations: Not on file  . Attends Archivist Meetings: Not on file  . Marital Status: Not on file  Intimate Partner Violence:   . Fear of Current or Ex-Partner: Not on file  . Emotionally Abused: Not on file  . Physically Abused: Not on file  . Sexually Abused: Not on file   Social History   Tobacco Use  Smoking  Status Current Every Day Smoker  . Packs/day: 1.00  . Years: 25.00  . Pack years: 25.00  . Types: Cigarettes  Smokeless Tobacco Never Used   Social History   Substance and Sexual Activity  Alcohol Use No  . Alcohol/week: 0.0 standard drinks    Family History:  Family History  Problem Relation Age of Onset  . Diabetes Mother   . Kidney disease Mother   . Hyperlipidemia Mother   . Hypertension Mother   . Brain cancer Father   . Lung cancer Father   . Deep vein thrombosis Sister   . Factor V Leiden deficiency Sister   . Lung cancer Paternal Aunt   . Heart disease Maternal Grandmother   . Diabetes Maternal Grandmother   . Alzheimer's disease Paternal Grandmother   . Melanoma Paternal Grandmother     Past medical history, surgical history, medications, allergies, family history and social history reviewed with patient today and changes made to appropriate areas of the chart.   Review of Systems - General ROS: negative Psychological ROS: negative Ophthalmic ROS: negative ENT ROS: negative Allergy and Immunology ROS: negative Hematological and Lymphatic ROS: negative Endocrine ROS: negative Breast ROS: negative for breast lumps Respiratory ROS: no cough, shortness of breath, or wheezing Cardiovascular ROS: no chest pain or dyspnea on exertion Gastrointestinal ROS: no abdominal pain, change in bowel habits, or black or bloody stools Genito-Urinary ROS: no dysuria, trouble voiding, or hematuria Musculoskeletal ROS: positive for - back pain Neurological ROS: no TIA or stroke symptoms Dermatological ROS: negative All other ROS negative except what is listed above and in the HPI.      Objective:    BP 124/68   Pulse 80   Temp 97.9 F (36.6 C) (Oral)   Ht 5' 10.5" (1.791 m)   Wt 233 lb (105.7 kg)   SpO2 96%   BMI 32.96 kg/m   Wt Readings from Last 3 Encounters:  01/17/20 233 lb (105.7 kg)  12/13/19 230 lb (104.3 kg)  12/02/18 222 lb 9.6 oz (101 kg)    Physical  Exam Vitals and nursing note reviewed.  Constitutional:      General: He is not in acute distress.    Appearance: He is well-developed.  HENT:     Head: Atraumatic.     Right Ear: Tympanic membrane and external ear normal.     Left Ear: Tympanic membrane and external ear normal.  Nose: Nose normal.     Mouth/Throat:     Mouth: Mucous membranes are moist.     Pharynx: Oropharynx is clear.  Eyes:     General: No scleral icterus.    Conjunctiva/sclera: Conjunctivae normal.     Pupils: Pupils are equal, round, and reactive to light.  Cardiovascular:     Rate and Rhythm: Normal rate and regular rhythm.     Heart sounds: Normal heart sounds. No murmur.  Pulmonary:     Effort: Pulmonary effort is normal. No respiratory distress.     Breath sounds: Normal breath sounds.  Abdominal:     General: Bowel sounds are normal. There is no distension.     Palpations: Abdomen is soft. There is no mass.     Tenderness: There is no abdominal tenderness. There is no guarding.  Genitourinary:    Comments: GU exam declined Musculoskeletal:        General: No tenderness. Normal range of motion.     Cervical back: Normal range of motion and neck supple.  Skin:    General: Skin is warm and dry.     Findings: No rash.  Neurological:     General: No focal deficit present.     Mental Status: He is alert and oriented to person, place, and time.     Deep Tendon Reflexes: Reflexes are normal and symmetric.  Psychiatric:        Mood and Affect: Mood normal.        Behavior: Behavior normal.        Thought Content: Thought content normal.        Judgment: Judgment normal.     Results for orders placed or performed in visit on 01/17/20  Microscopic Examination   URINE  Result Value Ref Range   WBC, UA None seen 0 - 5 /hpf   RBC None seen 0 - 2 /hpf   Epithelial Cells (non renal) 0-10 0 - 10 /hpf   Mucus, UA Present Not Estab.   Bacteria, UA None seen None seen/Few  CBC with  Differential/Platelet  Result Value Ref Range   WBC 12.7 (H) 3.4 - 10.8 x10E3/uL   RBC 4.93 4.14 - 5.80 x10E6/uL   Hemoglobin 15.7 13.0 - 17.7 g/dL   Hematocrit 45.3 37.5 - 51.0 %   MCV 92 79 - 97 fL   MCH 31.8 26.6 - 33.0 pg   MCHC 34.7 31.5 - 35.7 g/dL   RDW 12.6 11.6 - 15.4 %   Platelets 224 150 - 450 x10E3/uL   Neutrophils 55 Not Estab. %   Lymphs 29 Not Estab. %   Monocytes 12 Not Estab. %   Eos 3 Not Estab. %   Basos 1 Not Estab. %   Neutrophils Absolute 7.1 (H) 1.4 - 7.0 x10E3/uL   Lymphocytes Absolute 3.6 (H) 0.7 - 3.1 x10E3/uL   Monocytes Absolute 1.5 (H) 0.1 - 0.9 x10E3/uL   EOS (ABSOLUTE) 0.4 0.0 - 0.4 x10E3/uL   Basophils Absolute 0.1 0.0 - 0.2 x10E3/uL   Immature Granulocytes 0 Not Estab. %   Immature Grans (Abs) 0.0 0.0 - 0.1 x10E3/uL  Comprehensive metabolic panel  Result Value Ref Range   Glucose 83 65 - 99 mg/dL   BUN 14 6 - 24 mg/dL   Creatinine, Ser 0.92 0.76 - 1.27 mg/dL   GFR calc non Af Amer 99 >59 mL/min/1.73   GFR calc Af Amer 114 >59 mL/min/1.73   BUN/Creatinine Ratio 15 9 - 20  Sodium 139 134 - 144 mmol/L   Potassium 4.5 3.5 - 5.2 mmol/L   Chloride 100 96 - 106 mmol/L   CO2 24 20 - 29 mmol/L   Calcium 9.8 8.7 - 10.2 mg/dL   Total Protein 7.8 6.0 - 8.5 g/dL   Albumin 4.9 4.0 - 5.0 g/dL   Globulin, Total 2.9 1.5 - 4.5 g/dL   Albumin/Globulin Ratio 1.7 1.2 - 2.2   Bilirubin Total 0.6 0.0 - 1.2 mg/dL   Alkaline Phosphatase 57 39 - 117 IU/L   AST 33 0 - 40 IU/L   ALT 54 (H) 0 - 44 IU/L  Lipid Panel w/o Chol/HDL Ratio  Result Value Ref Range   Cholesterol, Total 238 (H) 100 - 199 mg/dL   Triglycerides 212 (H) 0 - 149 mg/dL   HDL 37 (L) >39 mg/dL   VLDL Cholesterol Cal 39 5 - 40 mg/dL   LDL Chol Calc (NIH) 162 (H) 0 - 99 mg/dL  UA/M w/rflx Culture, Routine   Specimen: Urine   URINE  Result Value Ref Range   Specific Gravity, UA 1.020 1.005 - 1.030   pH, UA 7.0 5.0 - 7.5   Color, UA Yellow Yellow   Appearance Ur Clear Clear   Leukocytes,UA  Negative Negative   Protein,UA Trace (A) Negative/Trace   Glucose, UA Negative Negative   Ketones, UA Negative Negative   RBC, UA Negative Negative   Bilirubin, UA Negative Negative   Urobilinogen, Ur 1.0 0.2 - 1.0 mg/dL   Nitrite, UA Negative Negative   Microscopic Examination See below:   HgB A1c  Result Value Ref Range   Hgb A1c MFr Bld 6.3 (H) 4.8 - 5.6 %   Est. average glucose Bld gHb Est-mCnc 134 mg/dL      Assessment & Plan:   Problem List Items Addressed This Visit      Other   Hypercholesteremia - Primary    Recheck lipids, adjust as needed. Discussed good lifestyle habits for control      Relevant Orders   Comprehensive metabolic panel (Completed)   Lipid Panel w/o Chol/HDL Ratio (Completed)   Pre-diabetes    Recheck A1C, continue working on diet and exercise for control      Relevant Orders   UA/M w/rflx Culture, Routine (Completed)   HgB A1c (Completed)   Back pain, lumbosacral    Suspect both muscular and arthritic, will obtain new imaging of low back, continue current regimen, and possibly refer to specialist based on imaging given the extent of his sxs at this point      Relevant Medications   cyclobenzaprine (FLEXERIL) 10 MG tablet   meloxicam (MOBIC) 15 MG tablet   Other Relevant Orders   DG Lumbar Spine Complete    Other Visit Diagnoses    Annual physical exam       Relevant Orders   CBC with Differential/Platelet (Completed)   Flu vaccine need       Relevant Orders   Flu Vaccine QUAD 36+ mos IM (Completed)       Discussed aspirin prophylaxis for myocardial infarction prevention and decision was it was not indicated  LABORATORY TESTING:  Health maintenance labs ordered today as discussed above.   The natural history of prostate cancer and ongoing controversy regarding screening and potential treatment outcomes of prostate cancer has been discussed with the patient. The meaning of a false positive PSA and a false negative PSA has been  discussed. He indicates understanding of the limitations of this screening  test and wishes not to proceed with screening PSA testing.   IMMUNIZATIONS:   - Tdap: Tetanus vaccination status reviewed: last tetanus booster within 10 years. - Influenza: Up to date  PATIENT COUNSELING:    Sexuality: Discussed sexually transmitted diseases, partner selection, use of condoms, avoidance of unintended pregnancy  and contraceptive alternatives.   Advised to avoid cigarette smoking.  I discussed with the patient that most people either abstain from alcohol or drink within safe limits (<=14/week and <=4 drinks/occasion for males, <=7/weeks and <= 3 drinks/occasion for females) and that the risk for alcohol disorders and other health effects rises proportionally with the number of drinks per week and how often a drinker exceeds daily limits.  Discussed cessation/primary prevention of drug use and availability of treatment for abuse.   Diet: Encouraged to adjust caloric intake to maintain  or achieve ideal body weight, to reduce intake of dietary saturated fat and total fat, to limit sodium intake by avoiding high sodium foods and not adding table salt, and to maintain adequate dietary potassium and calcium preferably from fresh fruits, vegetables, and low-fat dairy products.    stressed the importance of regular exercise  Injury prevention: Discussed safety belts, safety helmets, smoke detector, smoking near bedding or upholstery.   Dental health: Discussed importance of regular tooth brushing, flossing, and dental visits.   Follow up plan: NEXT PREVENTATIVE PHYSICAL DUE IN 1 YEAR. Return in about 1 year (around 01/16/2021) for CPE.

## 2020-01-18 ENCOUNTER — Ambulatory Visit
Admission: RE | Admit: 2020-01-18 | Discharge: 2020-01-18 | Disposition: A | Discharge status: Home / Self Care | Payer: BC Managed Care – PPO | Source: Ambulatory Visit | Attending: Family Medicine | Admitting: Family Medicine

## 2020-01-18 ENCOUNTER — Ambulatory Visit
Admission: RE | Admit: 2020-01-18 | Discharge: 2020-01-18 | Disposition: A | Discharge status: Home / Self Care | Payer: BC Managed Care – PPO | Source: Home / Self Care | Attending: Family Medicine | Admitting: Family Medicine

## 2020-01-18 DIAGNOSIS — M545 Low back pain, unspecified: Secondary | ICD-10-CM

## 2020-01-18 DIAGNOSIS — M47816 Spondylosis without myelopathy or radiculopathy, lumbar region: Secondary | ICD-10-CM | POA: Diagnosis not present

## 2020-01-18 LAB — COMPREHENSIVE METABOLIC PANEL
ALT: 54 IU/L — ABNORMAL HIGH (ref 0–44)
AST: 33 IU/L (ref 0–40)
Albumin/Globulin Ratio: 1.7 (ref 1.2–2.2)
Albumin: 4.9 g/dL (ref 4.0–5.0)
Alkaline Phosphatase: 57 IU/L (ref 39–117)
BUN/Creatinine Ratio: 15 (ref 9–20)
BUN: 14 mg/dL (ref 6–24)
Bilirubin Total: 0.6 mg/dL (ref 0.0–1.2)
CO2: 24 mmol/L (ref 20–29)
Calcium: 9.8 mg/dL (ref 8.7–10.2)
Chloride: 100 mmol/L (ref 96–106)
Creatinine, Ser: 0.92 mg/dL (ref 0.76–1.27)
GFR calc Af Amer: 114 mL/min/{1.73_m2} (ref >59)
GFR calc non Af Amer: 99 mL/min/{1.73_m2} (ref >59)
Globulin, Total: 2.9 g/dL (ref 1.5–4.5)
Glucose: 83 mg/dL (ref 65–99)
Potassium: 4.5 mmol/L (ref 3.5–5.2)
Sodium: 139 mmol/L (ref 134–144)
Total Protein: 7.8 g/dL (ref 6.0–8.5)

## 2020-01-18 LAB — CBC WITH DIFFERENTIAL/PLATELET
Basophils Absolute: 0.1 10*3/uL (ref 0.0–0.2)
Basos: 1 %
EOS (ABSOLUTE): 0.4 10*3/uL (ref 0.0–0.4)
Eos: 3 %
Hematocrit: 45.3 % (ref 37.5–51.0)
Hemoglobin: 15.7 g/dL (ref 13.0–17.7)
Immature Grans (Abs): 0 10*3/uL (ref 0.0–0.1)
Immature Granulocytes: 0 %
Lymphocytes Absolute: 3.6 10*3/uL — ABNORMAL HIGH (ref 0.7–3.1)
Lymphs: 29 %
MCH: 31.8 pg (ref 26.6–33.0)
MCHC: 34.7 g/dL (ref 31.5–35.7)
MCV: 92 fL (ref 79–97)
Monocytes Absolute: 1.5 10*3/uL — ABNORMAL HIGH (ref 0.1–0.9)
Monocytes: 12 %
Neutrophils Absolute: 7.1 10*3/uL — ABNORMAL HIGH (ref 1.4–7.0)
Neutrophils: 55 %
Platelets: 224 10*3/uL (ref 150–450)
RBC: 4.93 x10E6/uL (ref 4.14–5.80)
RDW: 12.6 % (ref 11.6–15.4)
WBC: 12.7 10*3/uL — ABNORMAL HIGH (ref 3.4–10.8)

## 2020-01-18 LAB — LIPID PANEL W/O CHOL/HDL RATIO
Cholesterol, Total: 238 mg/dL — ABNORMAL HIGH (ref 100–199)
HDL: 37 mg/dL — ABNORMAL LOW (ref >39)
LDL Chol Calc (NIH): 162 mg/dL — ABNORMAL HIGH (ref 0–99)
Triglycerides: 212 mg/dL — ABNORMAL HIGH (ref 0–149)
VLDL Cholesterol Cal: 39 mg/dL (ref 5–40)

## 2020-01-18 LAB — HEMOGLOBIN A1C
Est. average glucose Bld gHb Est-mCnc: 134 mg/dL
Hgb A1c MFr Bld: 6.3 % — ABNORMAL HIGH (ref 4.8–5.6)

## 2020-01-18 NOTE — Assessment & Plan Note (Signed)
Suspect both muscular and arthritic, will obtain new imaging of low back, continue current regimen, and possibly refer to specialist based on imaging given the extent of his sxs at this point

## 2020-01-18 NOTE — Assessment & Plan Note (Signed)
Recheck lipids, adjust as needed. Discussed good lifestyle habits for control

## 2020-01-18 NOTE — Assessment & Plan Note (Signed)
Recheck A1C, continue working on diet and exercise for control

## 2020-07-09 ENCOUNTER — Encounter: Payer: Self-pay | Admitting: Family Medicine

## 2021-01-17 ENCOUNTER — Encounter: Payer: BC Managed Care – PPO | Admitting: Family Medicine

## 2021-01-17 ENCOUNTER — Other Ambulatory Visit: Payer: Self-pay

## 2021-01-17 ENCOUNTER — Ambulatory Visit (INDEPENDENT_AMBULATORY_CARE_PROVIDER_SITE_OTHER): Payer: BC Managed Care – PPO | Admitting: Family Medicine

## 2021-01-17 ENCOUNTER — Encounter: Payer: Self-pay | Admitting: Family Medicine

## 2021-01-17 VITALS — BP 122/83 | HR 75 | Temp 98.5°F | Ht 70.75 in | Wt 235.2 lb

## 2021-01-17 DIAGNOSIS — Z23 Encounter for immunization: Secondary | ICD-10-CM | POA: Diagnosis not present

## 2021-01-17 DIAGNOSIS — E7800 Pure hypercholesterolemia, unspecified: Secondary | ICD-10-CM

## 2021-01-17 DIAGNOSIS — R7303 Prediabetes: Secondary | ICD-10-CM | POA: Diagnosis not present

## 2021-01-17 DIAGNOSIS — Z1211 Encounter for screening for malignant neoplasm of colon: Secondary | ICD-10-CM

## 2021-01-17 DIAGNOSIS — Z0000 Encounter for general adult medical examination without abnormal findings: Secondary | ICD-10-CM

## 2021-01-17 DIAGNOSIS — M545 Low back pain, unspecified: Secondary | ICD-10-CM

## 2021-01-17 LAB — URINALYSIS, ROUTINE W REFLEX MICROSCOPIC
Bilirubin, UA: NEGATIVE
Glucose, UA: NEGATIVE
Ketones, UA: NEGATIVE
Leukocytes,UA: NEGATIVE
Nitrite, UA: NEGATIVE
Protein,UA: NEGATIVE
RBC, UA: NEGATIVE
Specific Gravity, UA: 1.02 (ref 1.005–1.030)
Urobilinogen, Ur: 0.2 mg/dL (ref 0.2–1.0)
pH, UA: 7 (ref 5.0–7.5)

## 2021-01-17 LAB — BAYER DCA HB A1C WAIVED: HB A1C (BAYER DCA - WAIVED): 6 % (ref <7.0)

## 2021-01-17 MED ORDER — CYCLOBENZAPRINE HCL 10 MG PO TABS: 10.0000 mg | ORAL_TABLET | Freq: Every evening | ORAL | 3 refills | Status: DC | PRN

## 2021-01-17 MED ORDER — MELOXICAM 15 MG PO TABS: 15.0000 mg | ORAL_TABLET | Freq: Every day | ORAL | 3 refills | Status: DC

## 2021-01-17 NOTE — Patient Instructions (Signed)

## 2021-01-17 NOTE — Assessment & Plan Note (Signed)
Rechecking labs today. Await results. Treat as needed.  °

## 2021-01-17 NOTE — Progress Notes (Signed)
BP 122/83   Pulse 75   Temp 98.5 F (36.9 C)   Ht 5' 10.75" (1.797 m)   Wt 235 lb 3.2 oz (106.7 kg)   SpO2 98%   BMI 33.04 kg/m    Subjective:    Patient ID: Kevin Cobb, male    DOB: 1971-01-25, 50 y.o.   MRN: [id number]  HPI: Kevin Cobb is a 50 y.o. male presenting on 01/17/2021 for comprehensive medical examination. Current medical complaints include: none  Interim Problems from his last visit: no  Depression Screen done today and results listed below:  Depression screen [facility] 2/9 01/17/2021 10/31/2018 03/09/2017 04/08/2016  Decreased Interest 0 0 0 0  Down, Depressed, Hopeless 0 0 0 0  PHQ - 2 Score 0 0 0 0  Altered sleeping - 0 - -  Tired, decreased energy - 0 - -  Change in appetite - 1 - -  Feeling bad or failure about yourself  - 0 - -  Trouble concentrating - 0 - -  Moving slowly or fidgety/restless - 0 - -  Suicidal thoughts - 0 - -  PHQ-9 Score - 1 - -    Past Medical History:  Past Medical History:  Diagnosis Date  . Back pain   . Cataract   . Hyperlipidemia   . Tennis elbow     Surgical History:  Past Surgical History:  Procedure Laterality Date  . CATARACT EXTRACTION    . FRACTURE SURGERY    . LEG SURGERY Right    childhood    Medications:  No current outpatient medications on file prior to visit.   No current facility-administered medications on file prior to visit.    Allergies:  No Known Allergies  Social History:  Social History   Socioeconomic History  . Marital status: Married    Spouse name: Not on file  . Number of children: Not on file  . Years of education: Not on file  . Highest education level: Not on file  Occupational History  . Not on file  Tobacco Use  . Smoking status: Current Every Day Smoker    Packs/day: 1.00    Years: 25.00    Pack years: 25.00    Types: Cigarettes  . Smokeless tobacco: Never Used  Vaping Use  . Vaping Use: Never used  Substance and Sexual Activity  . Alcohol use: No     Alcohol/week: 0.0 standard drinks  . Drug use: No  . Sexual activity: Yes  Other Topics Concern  . Not on file  Social History Narrative  . Not on file   Social Determinants of Health   Financial Resource Strain: Not on file  Food Insecurity: Not on file  Transportation Needs: Not on file  Physical Activity: Not on file  Stress: Not on file  Social Connections: Not on file  Intimate Partner Violence: Not on file   Social History   Tobacco Use  Smoking Status Current Every Day Smoker  . Packs/day: 1.00  . Years: 25.00  . Pack years: 25.00  . Types: Cigarettes  Smokeless Tobacco Never Used   Social History   Substance and Sexual Activity  Alcohol Use No  . Alcohol/week: 0.0 standard drinks    Family History:  Family History  Problem Relation Age of Onset  . Diabetes Mother   . Kidney disease Mother   . Hyperlipidemia Mother   . Hypertension Mother   . Brain cancer Father   . Lung cancer Father   .  Deep vein thrombosis Sister   . Factor V Leiden deficiency Sister   . Lung cancer Paternal Aunt   . Heart disease Maternal Grandmother   . Diabetes Maternal Grandmother   . Alzheimer's disease Paternal Grandmother   . Melanoma Paternal Grandmother     Past medical history, surgical history, medications, allergies, family history and social history reviewed with patient today and changes made to appropriate areas of the chart.   Review of Systems  Constitutional: Negative.   HENT: Negative.   Eyes: Negative.        1 episode of black floater x day  Respiratory: Negative.   Cardiovascular: Negative.   Gastrointestinal: Positive for heartburn. Negative for abdominal pain, blood in stool, constipation, diarrhea, melena, nausea and vomiting.  Genitourinary: Negative.   Musculoskeletal: Negative.   Skin: Negative.   Neurological: Negative.   Endo/Heme/Allergies: Negative.   Psychiatric/Behavioral: Negative.    All other ROS negative except what is listed above  and in the HPI.      Objective:    BP 122/83   Pulse 75   Temp 98.5 F (36.9 C)   Ht 5' 10.75" (1.797 m)   Wt 235 lb 3.2 oz (106.7 kg)   SpO2 98%   BMI 33.04 kg/m   Wt Readings from Last 3 Encounters:  01/17/21 235 lb 3.2 oz (106.7 kg)  01/17/20 233 lb (105.7 kg)  12/13/19 230 lb (104.3 kg)    Physical Exam Vitals and nursing note reviewed.  Constitutional:      General: He is not in acute distress.    Appearance: Normal appearance. He is obese. He is not ill-appearing, toxic-appearing or diaphoretic.  HENT:     Head: Normocephalic and atraumatic.     Right Ear: Tympanic membrane, ear canal and external ear normal. There is no impacted cerumen.     Left Ear: Tympanic membrane, ear canal and external ear normal. There is no impacted cerumen.     Nose: Nose normal. No congestion or rhinorrhea.     Mouth/Throat:     Mouth: Mucous membranes are moist.     Pharynx: Oropharynx is clear. No oropharyngeal exudate or posterior oropharyngeal erythema.  Eyes:     General: No scleral icterus.       Right eye: No discharge.        Left eye: No discharge.     Extraocular Movements: Extraocular movements intact.     Conjunctiva/sclera: Conjunctivae normal.     Pupils: Pupils are equal, round, and reactive to light.  Neck:     Vascular: No carotid bruit.  Cardiovascular:     Rate and Rhythm: Normal rate and regular rhythm.     Pulses: Normal pulses.     Heart sounds: No murmur heard. No friction rub. No gallop.   Pulmonary:     Effort: Pulmonary effort is normal. No respiratory distress.     Breath sounds: Normal breath sounds. No stridor. No wheezing, rhonchi or rales.  Chest:     Chest wall: No tenderness.  Abdominal:     General: Abdomen is flat. Bowel sounds are normal. There is no distension.     Palpations: Abdomen is soft. There is no mass.     Tenderness: There is no abdominal tenderness. There is no right CVA tenderness, left CVA tenderness, guarding or rebound.      Hernia: No hernia is present.  Genitourinary:    Comments: Genital exam deferred with shared decision making Musculoskeletal:  General: No swelling, tenderness, deformity or signs of injury.     Cervical back: Normal range of motion and neck supple. No rigidity. No muscular tenderness.     Right lower leg: No edema.     Left lower leg: No edema.  Lymphadenopathy:     Cervical: No cervical adenopathy.  Skin:    General: Skin is warm and dry.     Capillary Refill: Capillary refill takes less than 2 seconds.     Coloration: Skin is not jaundiced or pale.     Findings: No bruising, erythema, lesion or rash.  Neurological:     General: No focal deficit present.     Mental Status: He is alert and oriented to person, place, and time.     Cranial Nerves: No cranial nerve deficit.     Sensory: No sensory deficit.     Motor: No weakness.     Coordination: Coordination normal.     Gait: Gait normal.     Deep Tendon Reflexes: Reflexes normal.  Psychiatric:        Mood and Affect: Mood normal.        Behavior: Behavior normal.        Thought Content: Thought content normal.        Judgment: Judgment normal.     Results for orders placed or performed in visit on 01/17/20  Microscopic Examination   URINE  Result Value Ref Range   WBC, UA None seen 0 - 5 /hpf   RBC None seen 0 - 2 /hpf   Epithelial Cells (non renal) 0-10 0 - 10 /hpf   Mucus, UA Present Not Estab.   Bacteria, UA None seen None seen/Few  CBC with Differential/Platelet  Result Value Ref Range   WBC 12.7 (H) 3.4 - 10.8 x10E3/uL   RBC 4.93 4.14 - 5.80 x10E6/uL   Hemoglobin 15.7 13.0 - 17.7 g/dL   Hematocrit 45.3 37.5 - 51.0 %   MCV 92 79 - 97 fL   MCH 31.8 26.6 - 33.0 pg   MCHC 34.7 31.5 - 35.7 g/dL   RDW 12.6 11.6 - 15.4 %   Platelets 224 150 - 450 x10E3/uL   Neutrophils 55 Not Estab. %   Lymphs 29 Not Estab. %   Monocytes 12 Not Estab. %   Eos 3 Not Estab. %   Basos 1 Not Estab. %   Neutrophils Absolute  7.1 (H) 1.4 - 7.0 x10E3/uL   Lymphocytes Absolute 3.6 (H) 0.7 - 3.1 x10E3/uL   Monocytes Absolute 1.5 (H) 0.1 - 0.9 x10E3/uL   EOS (ABSOLUTE) 0.4 0.0 - 0.4 x10E3/uL   Basophils Absolute 0.1 0.0 - 0.2 x10E3/uL   Immature Granulocytes 0 Not Estab. %   Immature Grans (Abs) 0.0 0.0 - 0.1 x10E3/uL  Comprehensive metabolic panel  Result Value Ref Range   Glucose 83 65 - 99 mg/dL   BUN 14 6 - 24 mg/dL   Creatinine, Ser 0.92 0.76 - 1.27 mg/dL   GFR calc non Af Amer 99 >59 mL/min/1.73   GFR calc Af Amer 114 >59 mL/min/1.73   BUN/Creatinine Ratio 15 9 - 20   Sodium 139 134 - 144 mmol/L   Potassium 4.5 3.5 - 5.2 mmol/L   Chloride 100 96 - 106 mmol/L   CO2 24 20 - 29 mmol/L   Calcium 9.8 8.7 - 10.2 mg/dL   Total Protein 7.8 6.0 - 8.5 g/dL   Albumin 4.9 4.0 - 5.0 g/dL   Globulin, Total 2.9 1.5 -  4.5 g/dL   Albumin/Globulin Ratio 1.7 1.2 - 2.2   Bilirubin Total 0.6 0.0 - 1.2 mg/dL   Alkaline Phosphatase 57 39 - 117 IU/L   AST 33 0 - 40 IU/L   ALT 54 (H) 0 - 44 IU/L  Lipid Panel w/o Chol/HDL Ratio  Result Value Ref Range   Cholesterol, Total 238 (H) 100 - 199 mg/dL   Triglycerides 212 (H) 0 - 149 mg/dL   HDL 37 (L) >39 mg/dL   VLDL Cholesterol Cal 39 5 - 40 mg/dL   LDL Chol Calc (NIH) 162 (H) 0 - 99 mg/dL  UA/M w/rflx Culture, Routine   Specimen: Urine   URINE  Result Value Ref Range   Specific Gravity, UA 1.020 1.005 - 1.030   pH, UA 7.0 5.0 - 7.5   Color, UA Yellow Yellow   Appearance Ur Clear Clear   Leukocytes,UA Negative Negative   Protein,UA Trace (A) Negative/Trace   Glucose, UA Negative Negative   Ketones, UA Negative Negative   RBC, UA Negative Negative   Bilirubin, UA Negative Negative   Urobilinogen, Ur 1.0 0.2 - 1.0 mg/dL   Nitrite, UA Negative Negative   Microscopic Examination See below:   HgB A1c  Result Value Ref Range   Hgb A1c MFr Bld 6.3 (H) 4.8 - 5.6 %   Est. average glucose Bld gHb Est-mCnc 134 mg/dL      Assessment & Plan:   Problem List Items  Addressed This Visit      Other   Hypercholesteremia    Rechecking labs today. Await results. Treat as needed.       Relevant Orders   Comprehensive metabolic panel   Lipid Panel w/o Chol/HDL Ratio   Pre-diabetes    Rechecking labs today. Await results. Treat as needed.       Relevant Orders   Bayer DCA Hb A1c Waived   Back pain, lumbosacral    Refills of meloxicam and flexeril given today. Call with any concerns.       Relevant Medications   cyclobenzaprine (FLEXERIL) 10 MG tablet   meloxicam (MOBIC) 15 MG tablet    Other Visit Diagnoses    Routine general medical examination at a health care facility    -  Primary   Vaccines up to date/declined. Screening labs checked today. Cologuard ordered. Continue diet and exercise.    Relevant Orders   Comprehensive metabolic panel   CBC with Differential/Platelet   Lipid Panel w/o Chol/HDL Ratio   PSA   TSH   Urinalysis, Routine w reflex microscopic   Hepatitis C Antibody   Flu vaccine need       Flu shot given today.    Relevant Orders   Flu Vaccine QUAD 6+ mos PF IM (Fluarix Quad PF) (Completed)   Screening for colon cancer       Cologuard ordered today.    Relevant Orders   Cologuard       Discussed aspirin prophylaxis for myocardial infarction prevention and decision was it was not indicated  LABORATORY TESTING:  Health maintenance labs ordered today as discussed above.   The natural history of prostate cancer and ongoing controversy regarding screening and potential treatment outcomes of prostate cancer has been discussed with the patient. The meaning of a false positive PSA and a false negative PSA has been discussed. He indicates understanding of the limitations of this screening test and wishes  to proceed with screening PSA testing.   IMMUNIZATIONS:   - Tdap: Tetanus  vaccination status reviewed: last tetanus booster within 10 years. - Influenza: Administered today - Pneumovax: Not applicable -COVID:  Refused  SCREENING: - Colonoscopy: Cologuard ordered today.  Discussed with patient purpose of the colonoscopy is to detect colon cancer at curable precancerous or early stages   PATIENT COUNSELING:    Sexuality: Discussed sexually transmitted diseases, partner selection, use of condoms, avoidance of unintended pregnancy  and contraceptive alternatives.   Advised to avoid cigarette smoking.  I discussed with the patient that most people either abstain from alcohol or drink within safe limits (<=14/week and <=4 drinks/occasion for males, <=7/weeks and <= 3 drinks/occasion for females) and that the risk for alcohol disorders and other health effects rises proportionally with the number of drinks per week and how often a drinker exceeds daily limits.  Discussed cessation/primary prevention of drug use and availability of treatment for abuse.   Diet: Encouraged to adjust caloric intake to maintain  or achieve ideal body weight, to reduce intake of dietary saturated fat and total fat, to limit sodium intake by avoiding high sodium foods and not adding table salt, and to maintain adequate dietary potassium and calcium preferably from fresh fruits, vegetables, and low-fat dairy products.    stressed the importance of regular exercise  Injury prevention: Discussed safety belts, safety helmets, smoke detector, smoking near bedding or upholstery.   Dental health: Discussed importance of regular tooth brushing, flossing, and dental visits.   Follow up plan: NEXT PREVENTATIVE PHYSICAL DUE IN 1 YEAR. Return in about 1 year (around 01/17/2022).

## 2021-01-17 NOTE — Assessment & Plan Note (Signed)
Refills of meloxicam and flexeril given today. Call with any concerns.

## 2021-01-18 LAB — LIPID PANEL W/O CHOL/HDL RATIO
Cholesterol, Total: 272 mg/dL — ABNORMAL HIGH (ref 100–199)
HDL: 41 mg/dL (ref >39)
LDL Chol Calc (NIH): 208 mg/dL — ABNORMAL HIGH (ref 0–99)
Triglycerides: 124 mg/dL (ref 0–149)
VLDL Cholesterol Cal: 23 mg/dL (ref 5–40)

## 2021-01-18 LAB — COMPREHENSIVE METABOLIC PANEL
ALT: 29 IU/L (ref 0–44)
AST: 21 IU/L (ref 0–40)
Albumin/Globulin Ratio: 1.6 (ref 1.2–2.2)
Albumin: 4.7 g/dL (ref 4.0–5.0)
Alkaline Phosphatase: 61 IU/L (ref 44–121)
BUN/Creatinine Ratio: 13 (ref 9–20)
BUN: 12 mg/dL (ref 6–24)
Bilirubin Total: 0.3 mg/dL (ref 0.0–1.2)
CO2: 22 mmol/L (ref 20–29)
Calcium: 9.5 mg/dL (ref 8.7–10.2)
Chloride: 101 mmol/L (ref 96–106)
Creatinine, Ser: 0.94 mg/dL (ref 0.76–1.27)
GFR calc Af Amer: 110 mL/min/{1.73_m2} (ref >59)
GFR calc non Af Amer: 95 mL/min/{1.73_m2} (ref >59)
Globulin, Total: 2.9 g/dL (ref 1.5–4.5)
Glucose: 120 mg/dL — ABNORMAL HIGH (ref 65–99)
Potassium: 4.5 mmol/L (ref 3.5–5.2)
Sodium: 138 mmol/L (ref 134–144)
Total Protein: 7.6 g/dL (ref 6.0–8.5)

## 2021-01-18 LAB — CBC WITH DIFFERENTIAL/PLATELET
Basophils Absolute: 0.1 10*3/uL (ref 0.0–0.2)
Basos: 1 %
EOS (ABSOLUTE): 0.3 10*3/uL (ref 0.0–0.4)
Eos: 3 %
Hematocrit: 47.7 % (ref 37.5–51.0)
Hemoglobin: 16.4 g/dL (ref 13.0–17.7)
Immature Grans (Abs): 0 10*3/uL (ref 0.0–0.1)
Immature Granulocytes: 0 %
Lymphocytes Absolute: 3 10*3/uL (ref 0.7–3.1)
Lymphs: 30 %
MCH: 31.8 pg (ref 26.6–33.0)
MCHC: 34.4 g/dL (ref 31.5–35.7)
MCV: 93 fL (ref 79–97)
Monocytes Absolute: 1 10*3/uL — ABNORMAL HIGH (ref 0.1–0.9)
Monocytes: 10 %
Neutrophils Absolute: 5.7 10*3/uL (ref 1.4–7.0)
Neutrophils: 56 %
Platelets: 200 10*3/uL (ref 150–450)
RBC: 5.15 x10E6/uL (ref 4.14–5.80)
RDW: 12.6 % (ref 11.6–15.4)
WBC: 10.1 10*3/uL (ref 3.4–10.8)

## 2021-01-18 LAB — TSH: TSH: 1.05 u[IU]/mL (ref 0.450–4.500)

## 2021-01-18 LAB — PSA: Prostate Specific Ag, Serum: 0.6 ng/mL (ref 0.0–4.0)

## 2021-01-18 LAB — HEPATITIS C ANTIBODY: Hep C Virus Ab: <0.1 s/co ratio (ref 0.0–0.9)

## 2021-02-10 ENCOUNTER — Encounter: Payer: Self-pay | Admitting: Family Medicine

## 2021-02-10 DIAGNOSIS — Z1212 Encounter for screening for malignant neoplasm of rectum: Secondary | ICD-10-CM | POA: Diagnosis not present

## 2021-02-10 DIAGNOSIS — Z1211 Encounter for screening for malignant neoplasm of colon: Secondary | ICD-10-CM | POA: Diagnosis not present

## 2021-02-16 LAB — COLOGUARD: Cologuard: NEGATIVE

## 2021-06-01 NOTE — Progress Notes (Deleted)
   There were no vitals taken for this visit.   Subjective:    Patient ID: ZAMIRE WHITEHURST, male    DOB: 15-May-1971, 50 y.o.   MRN: [id number]  HPI: ELIS SAUBER is a 50 y.o. male  No chief complaint on file.  HYPERLIPIDEMIA Hyperlipidemia status: {Blank single:19197::"excellent compliance","good compliance","fair compliance","poor compliance"} Satisfied with current treatment?  {Blank single:19197::"yes","no"} Side effects:  {Blank single:19197::"yes","no"} Medication compliance: {Blank single:19197::"excellent compliance","good compliance","fair compliance","poor compliance"} Past cholesterol meds: {Blank multiple:19196::"none","atorvastain (lipitor)","lovastatin (mevacor)","pravastatin (pravachol)","rosuvastatin (crestor)","simvastatin (zocor)","vytorin","fenofibrate (tricor)","gemfibrozil","ezetimide (zetia)","niaspan","lovaza"} Supplements: {Blank multiple:19196::"none","fish oil","niacin","red yeast rice"} Aspirin:  {Blank single:19197::"yes","no"} The 10-year ASCVD risk score Mikey Bussing DC Jr., et al., 2013) is: 12.1%   Values used to calculate the score:     Age: 50 years     Sex: Male     Is Non-Hispanic African American: No     Diabetic: No     Tobacco smoker: Yes     Systolic Blood Pressure: 292 mmHg     Is BP treated: No     HDL Cholesterol: 41 mg/dL     Total Cholesterol: 272 mg/dL Chest pain:  {Blank single:19197::"yes","no"} Coronary artery disease:  {Blank single:19197::"yes","no"} Family history CAD:  {Blank single:19197::"yes","no"} Family history early CAD:  {Blank single:19197::"yes","no"}   Relevant past medical, surgical, family and social history reviewed and updated as indicated. Interim medical history since our last visit reviewed. Allergies and medications reviewed and updated.  Review of Systems  Per HPI unless specifically indicated above     Objective:    There were no vitals taken for this visit.  Wt Readings from Last 3 Encounters:   01/17/21 235 lb 3.2 oz (106.7 kg)  01/17/20 233 lb (105.7 kg)  12/13/19 230 lb (104.3 kg)    Physical Exam  Results for orders placed or performed in visit on 02/10/21  Cologuard  Result Value Ref Range   Cologuard Negative Negative      Assessment & Plan:   Problem List Items Addressed This Visit   None    Follow up plan: No follow-ups on file.

## 2021-06-02 ENCOUNTER — Ambulatory Visit: Payer: BC Managed Care – PPO | Admitting: Nurse Practitioner

## 2021-09-29 ENCOUNTER — Ambulatory Visit (INDEPENDENT_AMBULATORY_CARE_PROVIDER_SITE_OTHER): Payer: Self-pay

## 2021-09-29 ENCOUNTER — Other Ambulatory Visit: Payer: Self-pay

## 2021-09-29 ENCOUNTER — Encounter: Payer: Self-pay | Admitting: Emergency Medicine

## 2021-09-29 ENCOUNTER — Ambulatory Visit
Admission: EM | Admit: 2021-09-29 | Discharge: 2021-09-29 | Disposition: A | Discharge status: Home / Self Care | Payer: Self-pay | Attending: Physician Assistant | Admitting: Physician Assistant

## 2021-09-29 DIAGNOSIS — L0291 Cutaneous abscess, unspecified: Secondary | ICD-10-CM

## 2021-09-29 DIAGNOSIS — M25562 Pain in left knee: Secondary | ICD-10-CM

## 2021-09-29 MED ORDER — NAPROXEN 500 MG PO TABS: 500.0000 mg | ORAL_TABLET | Freq: Two times a day (BID) | ORAL | 0 refills | Status: DC

## 2021-09-29 MED ORDER — CEPHALEXIN 500 MG PO CAPS: 500.0000 mg | ORAL_CAPSULE | Freq: Two times a day (BID) | ORAL | 0 refills | Status: AC

## 2021-09-29 NOTE — Discharge Instructions (Addendum)
-  return to clinic in 48 hours for wound check and packing removal -Keflex: 1 tablet twice a day for 10 days -Naproxen 1 tablet twice daily.  Do not take ibuprofen or the meloxicam while taking this medication -Continue the Flexeril, especially at night to help sleep -Can try cool compresses for acute pain. -Can try a supportive brace or knee wrap to see if that helps with the pain. -X-rays negative for any acute knee changes.  If no improvement would recommend follow-up with [facility] or other orthopedic provider.  [facility] does have a orthopedics urgent care.

## 2021-09-29 NOTE — ED Triage Notes (Signed)
Pt c/o left knee pain. He states he has radiating pain from his lower back to his buttock and down his leg. He states he fell on his knee about 4 days ago. He has been taking tylenol, meloxicam, muscle relaxer and ibuprofen. He also has an abscess on her RUQ of his abdomen. Started about 2 weeks ago. Area is hard,red and swollen.

## 2021-09-29 NOTE — ED Provider Notes (Signed)
[facility]    CSN: [id number] Arrival date & time: 09/29/21  1026      History   Chief Complaint Chief Complaint  Patient presents with   Knee Pain    left   Abscess    HPI CAMRYN QUESINBERRY is a 50 y.o. male.   Patient is a 50 year old male who presents complaining of left knee pain as well as an abscess to his abdomen.  Patient states the pain started Wednesday night while he was at work.  Patient denies any trauma to the knee or any missteps or stopping.  He does report a fall a week prior where he landed on his buttocks.  Patient states he has been taking ibuprofen and Tylenol alternately during the day and then taking a muscle relaxer and meloxicam at night.  He states medications have not seemed to help.  States standing makes it worse as well as walking.  Patient also reports some soreness in his thigh like the pain is radiating and that the pain may alter depending on his position or whether he is sitting up or laying down.  He does report he does have some chronic back issues in addition to the recent fall.  Patient states the abscess area has been there for several weeks and he is not sure when it was first noted.  Reports is just hard and red and tender when touched.   Past Medical History:  Diagnosis Date   Back pain    Cataract    Hyperlipidemia    Tennis elbow     Patient Active Problem List   Diagnosis Date Noted   Back pain, lumbosacral 04/08/2016   Pre-diabetes 12/15/2015   Current tobacco use 10/20/2015   Hypercholesteremia 10/20/2015   Plantar fasciitis 10/20/2015    Past Surgical History:  Procedure Laterality Date   CATARACT EXTRACTION     FRACTURE SURGERY     LEG SURGERY Right    childhood       Home Medications    Prior to Admission medications   Medication Sig Start Date End Date Taking? Authorizing Provider  cephALEXin (KEFLEX) 500 MG capsule Take 1 capsule (500 mg total) by mouth 2 (two) times daily for 10 days. 09/29/21  10/09/21 Yes Luvenia Redden, PA-C  cyclobenzaprine (FLEXERIL) 10 MG tablet Take 1 tablet (10 mg total) by mouth at bedtime as needed for muscle spasms. 01/17/21  Yes Johnson, Megan P, DO  meloxicam (MOBIC) 15 MG tablet Take 1 tablet (15 mg total) by mouth daily. 01/17/21  Yes Johnson, Megan P, DO  naproxen (NAPROSYN) 500 MG tablet Take 1 tablet (500 mg total) by mouth 2 (two) times daily. 09/29/21  Yes Luvenia Redden, PA-C    Family History Family History  Problem Relation Age of Onset   Diabetes Mother    Kidney disease Mother    Hyperlipidemia Mother    Hypertension Mother    Brain cancer Father    Lung cancer Father    Deep vein thrombosis Sister    Factor V Leiden deficiency Sister    Lung cancer Paternal Aunt    Heart disease Maternal Grandmother    Diabetes Maternal Grandmother    Alzheimer's disease Paternal Grandmother    Melanoma Paternal Grandmother     Social History Social History   Tobacco Use   Smoking status: Every Day    Packs/day: 1.00    Years: 25.00    Pack years: 25.00    Types: Cigarettes  Smokeless tobacco: Never  Vaping Use   Vaping Use: Never used  Substance Use Topics   Alcohol use: No    Alcohol/week: 0.0 standard drinks   Drug use: No     Allergies   Patient has no known allergies.   Review of Systems Review of Systems as noted above in HPI.  Other systems reviewed and found to be negative   Physical Exam Triage Vital Signs ED Triage Vitals  Enc Vitals Group     BP 09/29/21 1208 (!) 156/93     Pulse Rate 09/29/21 1208 98     Resp 09/29/21 1208 18     Temp 09/29/21 1208 98.2 F (36.8 C)     Temp Source 09/29/21 1208 Oral     SpO2 09/29/21 1208 98 %     Weight 09/29/21 1205 235 lb 3.7 oz (106.7 kg)     Height 09/29/21 1205 5' 10.75" (1.797 m)     Head Circumference --      Peak Flow --      Pain Score 09/29/21 1205 7     Pain Loc --      Pain Edu? --      Excl. in [place]? --    No data found.  Updated Vital Signs BP  (!) 156/93 (BP Location: Left Arm)   Pulse 98   Temp 98.2 F (36.8 C) (Oral)   Resp 18   Ht 5' 10.75" (1.797 m)   Wt 235 lb 3.7 oz (106.7 kg)   SpO2 98%   BMI 33.04 kg/m    Physical Exam Constitutional:      Appearance: Normal appearance.  Cardiovascular:     Rate and Rhythm: Normal rate and regular rhythm.  Pulmonary:     Effort: Pulmonary effort is normal.  Musculoskeletal:     Right knee: Normal.     Left knee: No swelling, deformity, effusion or erythema. Tenderness (minimal to lateral side of knee) present.     Instability Tests: Anterior Lachman test: some minimal tenderness. Lateral McMurray test positive.     Comments: Pt also reported pain down thigh to lateral side of knee when he laid back on exam table to abscess treatment.  To  Skin:      Neurological:     Mental Status: He is alert.      UC Treatments / Results  Labs (all labs ordered are listed, but only abnormal results are displayed) Labs Reviewed - No data to display  EKG   Radiology DG Knee Complete 4 Views Left  Result Date: 09/29/2021 CLINICAL DATA:  Status post fall.  Knee pain. EXAM: LEFT KNEE - COMPLETE 4+ VIEW COMPARISON:  None. FINDINGS: No joint effusion identified. No fracture or dislocation. Small exostosis is identified arising off the medial aspect of the distal femur. IMPRESSION: 1. No acute findings. 2. Small exostosis arising off the medial aspect of the distal femur. Electronically Signed   By: Kerby Moors M.D.   On: 09/29/2021 12:41    Procedures Incision and Drainage  Date/Time: 09/29/2021 2:22 PM Performed by: Luvenia Redden, PA-C Authorized by: Luvenia Redden, PA-C   Consent:    Consent obtained:  Verbal   Consent given by:  Patient Location:    Type:  Abscess   Location:  Trunk   Trunk location:  Abdomen Pre-procedure details:    Skin preparation:  Chlorhexidine Sedation:    Sedation type:  None Anesthesia:    Anesthesia method:  Local infiltration  Local anesthetic:  Lidocaine 1% WITH epi Procedure type:    Complexity:  Simple Procedure details:    Ultrasound guidance: no     Incision types:  Stab incision   Incision depth:  Dermal   Wound management:  Probed and deloculated and irrigated with saline   Drainage:  Bloody and purulent (appeared to be purulent cyst-like material)   Drainage amount:  Moderate   Wound treatment:  Wound left open   Packing materials:  1/4 in iodoform gauze Post-procedure details:    Procedure completion:  Tolerated (including critical care time)  Medications Ordered in UC Medications - No data to display  Initial Impression / Assessment and Plan / UC Course  I have reviewed the triage vital signs and the nursing notes.  Pertinent labs & imaging results that were available during my care of the patient were reviewed by me and considered in my medical decision making (see chart for details).     Patient with left knee pain that started at a about a week after a fall onto his buttocks.  No trauma to the knee itself.  Pain with walking.  Pain also seems to be related to radiation to the thigh and both of those can vary depending on the patient's positioning.  Increase in pain with lying flat on his back today little bit of incline.  Some tenderness to palpation of the lateral side of the knee as well as some minimal pain with McMurray test.  Give patient prescription for naproxen and have him avoid taking ibuprofen and meloxicam at the same time.  Recommend knee brace or wrap for support.  Advised him if no improvement in the next couple days he should follow-up with orthopedics as this may be related to his recent back fall and not necessarily his knee.  I&D done to the abscess with return of purulent material, likely infected cyst.  Iodoform gauze placed and covered with bandage.  Given prescription for Keflex.  Have him return in 48 hours for wound check and possible gauze removal. Final Clinical  Impressions(s) / UC Diagnoses   Final diagnoses:  Abscess  Left knee pain, unspecified chronicity     Discharge Instructions      -Keflex: 1 tablet twice a day for 10 days -Naproxen 1 tablet twice daily.  Do not take ibuprofen or the meloxicam while taking this medication -Continue the Flexeril, especially at night to help sleep -Can try cool compresses for acute pain. -Can try a supportive brace or knee wrap to see if that helps with the pain. -X-rays negative for any acute knee changes.  If no improvement would recommend follow-up with [facility] or other orthopedic provider.  [facility] does have a orthopedics urgent care.   ED Prescriptions     Medication Sig Dispense Auth. Provider   cephALEXin (KEFLEX) 500 MG capsule Take 1 capsule (500 mg total) by mouth 2 (two) times daily for 10 days. 20 capsule Luvenia Redden, PA-C   naproxen (NAPROSYN) 500 MG tablet Take 1 tablet (500 mg total) by mouth 2 (two) times daily. 30 tablet Luvenia Redden, PA-C      PDMP not reviewed this encounter.   Luvenia Redden, PA-C 09/29/21 1428

## 2021-10-01 ENCOUNTER — Other Ambulatory Visit: Payer: Self-pay

## 2021-10-01 ENCOUNTER — Ambulatory Visit
Admission: EM | Admit: 2021-10-01 | Discharge: 2021-10-01 | Disposition: A | Discharge status: Home / Self Care | Payer: Self-pay | Attending: Emergency Medicine | Admitting: Emergency Medicine

## 2021-10-01 ENCOUNTER — Encounter: Payer: Self-pay | Admitting: Emergency Medicine

## 2021-10-01 DIAGNOSIS — M25562 Pain in left knee: Secondary | ICD-10-CM

## 2021-10-01 DIAGNOSIS — Z09 Encounter for follow-up examination after completed treatment for conditions other than malignant neoplasm: Secondary | ICD-10-CM

## 2021-10-01 NOTE — Discharge Instructions (Addendum)
Knee: Ice, neoprene knee sleeve for compression and support.  Take the Naprosyn and Tylenol together twice a day.  May take an additional gram of Tylenol 2 more times a day.  Do not exceed 4000 mg of Tylenol from all sources in 24 hours.  Please follow-up with orthopedics as soon as you can.  Abscess: Continue warm compresses, keeping this clean with soap and water.  Finish the Keflex, even if you feel better.

## 2021-10-01 NOTE — ED Triage Notes (Signed)
Pt was seen here on 09/29/21 for left knee/leg pain and abscess to LUQ. He returns today for wound check/packing removal and also for continued left leg pain x 6 days.

## 2021-10-01 NOTE — ED Provider Notes (Signed)
HPI  SUBJECTIVE:  Kevin Cobb is a 50 y.o. male who presents for 2 issues: Wound check/packing removal status post I&D 2 days ago and persistent left knee pain.  He states that he has continued drainage from the I&D site on his left upper abdomen, but that is overall getting better.  He denies pain, fevers, worsening erythema.  He is taking his Keflex.  He has been doing wound care.  Patient states that he has had left lateral knee pain for 6 days which is now radiating down his anterior leg.  He describes it as throbbing, dull, intermittent, lasting hours, depending on his activity.  No joint swelling, erythema.  No rash on his leg, posterior calf pain, calf swelling.  No change in his physical activity.  He has been taking Naprosyn, Tylenol and meloxicam.  No alleviating factors.  Symptoms are worse with walking for prolonged periods of time, lying down and putting torque on his knee.  He denies new or different back pain.  He did have a fall onto his buttocks 1 week ago.  No surgery in the past 4 weeks, recent immobilization, hemoptysis.  Patient was seen here 2 days ago for this.  He had a knee x-ray that showed a small exocytosis of the medial aspect femur.  He was started on Naprosyn, advised to stop taking ibuprofen and meloxicam, and to start a knee brace or wrap for support.  Plan was to follow-up with orthopedics.  Past medical history negative for DVT, PE, cancer.  PMD: [facility].   Past Medical History:  Diagnosis Date   Back pain    Cataract    Hyperlipidemia    Tennis elbow     Past Surgical History:  Procedure Laterality Date   CATARACT EXTRACTION     FRACTURE SURGERY     LEG SURGERY Right    childhood    Family History  Problem Relation Age of Onset   Diabetes Mother    Kidney disease Mother    Hyperlipidemia Mother    Hypertension Mother    Brain cancer Father    Lung cancer Father    Deep vein thrombosis Sister    Factor V Leiden  deficiency Sister    Lung cancer Paternal Aunt    Heart disease Maternal Grandmother    Diabetes Maternal Grandmother    Alzheimer's disease Paternal Grandmother    Melanoma Paternal Grandmother     Social History   Tobacco Use   Smoking status: Every Day    Packs/day: 1.00    Years: 25.00    Pack years: 25.00    Types: Cigarettes   Smokeless tobacco: Never  Vaping Use   Vaping Use: Never used  Substance Use Topics   Alcohol use: No    Alcohol/week: 0.0 standard drinks   Drug use: No    No current facility-administered medications for this encounter.  Current Outpatient Medications:    cephALEXin (KEFLEX) 500 MG capsule, Take 1 capsule (500 mg total) by mouth 2 (two) times daily for 10 days., Disp: 20 capsule, Rfl: 0   cyclobenzaprine (FLEXERIL) 10 MG tablet, Take 1 tablet (10 mg total) by mouth at bedtime as needed for muscle spasms., Disp: 90 tablet, Rfl: 3   naproxen (NAPROSYN) 500 MG tablet, Take 1 tablet (500 mg total) by mouth 2 (two) times daily., Disp: 30 tablet, Rfl: 0  No Known Allergies   ROS  As noted in HPI.   Physical Exam  BP (!) 128/92 (  BP Location: Left Arm)   Pulse 70   Temp 98.1 F (36.7 C) (Oral)   Resp 16   SpO2 98%   Constitutional: Well developed, well nourished, no acute distress Eyes:  EOMI, conjunctiva normal bilaterally HENT: Normocephalic, atraumatic,mucus membranes moist Respiratory: Normal inspiratory effort Cardiovascular: Normal rate GI: nondistended skin: 4 x 3 cm nontender area of erythema with minimal induration left upper quadrant.  Packing intact.  No expressible purulent drainage.   Musculoskeletal: L Knee: Tenderness along the retinaculum lateral to the patella.  McMurray positive.  No tenderness on tibia, anterior tibialis.  Compartments soft.  No posterior calf tenderness, swelling, no calf edema, calves symmetric.  No tenderness along the medial thigh along the deep venous system.   ROM baseline for Pt,  Flexion   intact, Patella NT,  Patellar tendon NT, Medial joint NT  Lateral joint NT , Popliteal region NT, Varus MCL stress testing stable, Valgus LCL stress testing stable,  Lachman's negative. Distal NVI with intact baseline sensation / motor / pulse distal to knee.  No effusion. No erythema. No increased temperature. No crepitus.   Neurologic: Alert & oriented x 3, no focal neuro deficits Psychiatric: Speech and behavior appropriate   ED Course   Medications - No data to display  No orders of the defined types were placed in this encounter.   No results found for this or any previous visit (from the past 24 hour(s)). DG Knee Complete 4 Views Left  Result Date: 09/29/2021 CLINICAL DATA:  Status post fall.  Knee pain. EXAM: LEFT KNEE - COMPLETE 4+ VIEW COMPARISON:  None. FINDINGS: No joint effusion identified. No fracture or dislocation. Small exostosis is identified arising off the medial aspect of the distal femur. IMPRESSION: 1. No acute findings. 2. Small exostosis arising off the medial aspect of the distal femur. Electronically Signed   By: Kerby Moors M.D.   On: 09/29/2021 12:41    ED Clinical Impression  1. Acute pain of left knee   2. Encounter for recheck of abscess following incision and drainage      ED Assessment/Plan  Previous records reviewed.  As noted in HPI  1.  Wound check 2 days post I&D.  Removed packing without any complications.  There is no expressible purulent drainage.  Patient is taking his Keflex and states that the erythema is getting better.  He is to finish the Keflex and continue wound care.  Placed Band-Aid.  2.  Persistent left knee pain.  He has a positive McMurray, wonder if he does not have some damage to the meniscus.  There is no evidence of infection, effusion, gout, compartment syndrome, shingles.  \will have him discontinue the meloxicam, continue Naprosyn, start Tylenol, ice, neoprene knee sleeve , advised follow-up with orthopedics for further  evaluation and management.  Work note for 2 days to give his knee additional time to rest.    Discussed  imaging, MDM, treatment plan, and plan for follow-up with patient.. patient agrees with plan.   No orders of the defined types were placed in this encounter.     *This clinic note was created using Dragon dictation software. Therefore, there may be occasional mistakes despite careful proofreading.  ?    Melynda Ripple, MD 10/02/21 [phone]

## 2022-05-27 ENCOUNTER — Ambulatory Visit (INDEPENDENT_AMBULATORY_CARE_PROVIDER_SITE_OTHER): Payer: BC Managed Care – PPO | Admitting: Nurse Practitioner

## 2022-05-27 ENCOUNTER — Encounter: Payer: Self-pay | Admitting: Nurse Practitioner

## 2022-05-27 VITALS — BP 124/77 | HR 79 | Temp 98.4°F | Ht 70.75 in | Wt 227.2 lb

## 2022-05-27 DIAGNOSIS — J029 Acute pharyngitis, unspecified: Secondary | ICD-10-CM | POA: Diagnosis not present

## 2022-05-27 MED ORDER — LIDOCAINE VISCOUS HCL 2 % MT SOLN: 15.0000 mL | OROMUCOSAL | 0 refills | Status: DC | PRN

## 2022-05-27 MED ORDER — AMOXICILLIN-POT CLAVULANATE 875-125 MG PO TABS: 1.0000 | ORAL_TABLET | Freq: Two times a day (BID) | ORAL | 0 refills | Status: AC

## 2022-05-27 NOTE — Progress Notes (Signed)
BP 124/77   Pulse 79   Temp 98.4 F (36.9 C) (Oral)   Ht 5' 10.75" (1.797 m)   Wt 227 lb 3.2 oz (103.1 kg)   SpO2 95%   BMI 31.91 kg/m    Subjective:    Patient ID: Kevin Cobb, male    DOB: Aug 31, 1971, 51 y.o.   MRN: [id number]  HPI: Kevin Cobb is a 51 y.o. male  Chief Complaint  Patient presents with   Sore Throat    Patient is here for Sore Throat. Patient says he has been dealing with a sore throat for the past 4 weeks. Patient says he has been taking Mucinex with Sore Throat, Numbing Throat Spray and Cough Drops. Patient says he has a cough as well, says at night is when the cough is really bad. Patient says he has been coughing up yellow mucus.    NOTE WRITTEN BY FNP STUDENT.  ASSESSMENT AND PLAN OF CARE REVIEWED WITH STUDENT, AGREE WITH ABOVE FINDINGS AND PLAN.   SORE THROAT Started 4 weeks ago with URI. Worst symptom: sore throat Fever: no Cough: yes Shortness of breath: no Wheezing: no Chest pain: no Chest tightness: no Chest congestion: no Nasal congestion: no Runny nose: no Post nasal drip: yes Sneezing: no Sore throat: yes Swollen glands: no Sinus pressure: no Headache: no Face pain: no Toothache: no Ear pain: no  Ear pressure: no  Eyes red/itching:no Eye drainage/crusting: no  Vomiting: no Rash: no Fatigue: no Sick contacts: no Strep contacts: no  Context: stable Recurrent sinusitis: no Relief with OTC cold/cough medications: yes  Treatments attempted:  cough drops and mucinex , salt water gargle.  Relevant past medical, surgical, family and social history reviewed and updated as indicated. Interim medical history since our last visit reviewed. Allergies and medications reviewed and updated.  Review of Systems  Constitutional:  Negative for chills, fatigue and fever.  HENT:  Positive for sore throat. Negative for congestion, ear pain, postnasal drip, rhinorrhea, sinus pressure, sinus pain and trouble swallowing.   Eyes:   Negative for pain, discharge, redness and itching.  Respiratory:  Negative for cough, chest tightness and shortness of breath.   Cardiovascular:  Negative for chest pain and palpitations.  Musculoskeletal: Negative.   Allergic/Immunologic: Negative.   Neurological:  Negative for dizziness, light-headedness and headaches.    Per HPI unless specifically indicated above     Objective:    BP 124/77   Pulse 79   Temp 98.4 F (36.9 C) (Oral)   Ht 5' 10.75" (1.797 m)   Wt 227 lb 3.2 oz (103.1 kg)   SpO2 95%   BMI 31.91 kg/m   Wt Readings from Last 3 Encounters:  05/27/22 227 lb 3.2 oz (103.1 kg)  09/29/21 235 lb 3.7 oz (106.7 kg)  01/17/21 235 lb 3.2 oz (106.7 kg)    Physical Exam Vitals and nursing note reviewed.  Constitutional:      General: He is not in acute distress.    Appearance: Normal appearance. He is well-developed. He is not ill-appearing.  HENT:     Head: Normocephalic.     Right Ear: Hearing, tympanic membrane, ear canal and external ear normal. No drainage.     Left Ear: Hearing, tympanic membrane, ear canal and external ear normal. No drainage.     Nose: Nose normal. No congestion.     Right Sinus: No maxillary sinus tenderness or frontal sinus tenderness.     Left Sinus: No maxillary sinus tenderness  or frontal sinus tenderness.     Mouth/Throat:     Lips: Pink.     Mouth: Mucous membranes are moist.     Pharynx: Oropharynx is clear. Posterior oropharyngeal erythema present.     Tonsils: Tonsillar exudate (small amount to L tonsil) present.  Eyes:     General: Lids are normal. No allergic shiner.    Conjunctiva/sclera: Conjunctivae normal.  Neck:     Thyroid: No thyromegaly.  Cardiovascular:     Rate and Rhythm: Normal rate and regular rhythm.     Pulses: Normal pulses.     Heart sounds: Normal heart sounds. No murmur heard. Pulmonary:     Effort: Pulmonary effort is normal.     Breath sounds: Normal breath sounds. No wheezing or rhonchi.  Abdominal:      General: Bowel sounds are normal.     Palpations: Abdomen is soft.  Musculoskeletal:     Cervical back: Normal range of motion and neck supple.     Right lower leg: No edema.     Left lower leg: No edema.  Lymphadenopathy:     Head:     Right side of head: No submental, submandibular, tonsillar, preauricular or posterior auricular adenopathy.     Left side of head: No submental, submandibular, tonsillar, preauricular or posterior auricular adenopathy.     Cervical: No cervical adenopathy.  Skin:    General: Skin is warm.  Neurological:     General: No focal deficit present.     Mental Status: He is alert.  Psychiatric:        Attention and Perception: Attention normal.        Mood and Affect: Mood normal.        Speech: Speech normal.        Behavior: Behavior normal. Behavior is cooperative.        Thought Content: Thought content normal.     Results for orders placed or performed in visit on 05/27/22  Rapid Strep Screen ([facility] ONLY)   Specimen: Other   Other  Result Value Ref Range   Strep Gp A Ag, IA W/Reflex Negative Negative  Culture, Group A Strep   Other  Result Value Ref Range   Strep A Culture WILL FOLLOW       Assessment & Plan:   Problem List Items Addressed This Visit       Other   Sore throat - Primary    Ongoing for 4 weeks. Rapid strep test today negative. Recommend to take OTC Claritin or Allegra. Augmentin sent due to length of symptoms + viscous lidocaine for symptom management.  Recommend: - Increased rest - Increasing Fluids - Acetaminophen / ibuprofen as needed for fever/pain.  - Salt water gargling, chloraseptic spray and throat lozenges - Humidifying the air. Return to office for worsening or ongoing symptoms.      Relevant Orders   Rapid Strep Screen ([facility] ONLY) (Completed)     Follow up plan: Return if symptoms worsen or fail to improve.

## 2022-05-27 NOTE — Progress Notes (Deleted)
   Wt 227 lb 3.2 oz (103.1 kg)   BMI 31.91 kg/m    Subjective:    Patient ID: Kevin Cobb, male    DOB: [birth year], 51 y.o.   MRN: [id number]  HPI: Kevin Cobb is a 51 y.o. male  Chief Complaint  Patient presents with   Sore Throat    Patient is here for Sore Throat.    SORE THROAT Worst symptom: Fever: {Blank single:19197::"yes","no"} Cough: {Blank single:19197::"yes","no"} Shortness of breath: {Blank single:19197::"yes","no"} Wheezing: {Blank single:19197::"yes","no"} Chest pain: {Blank single:19197::"yes","no","yes, with cough"} Chest tightness: {Blank single:19197::"yes","no"} Chest congestion: {Blank single:19197::"yes","no"} Nasal congestion: {Blank single:19197::"yes","no"} Runny nose: {Blank single:19197::"yes","no"} Post nasal drip: {Blank single:19197::"yes","no"} Sneezing: {Blank single:19197::"yes","no"} Sore throat: {Blank single:19197::"yes","no"} Swollen glands: {Blank single:19197::"yes","no"} Sinus pressure: {Blank single:19197::"yes","no"} Headache: {Blank single:19197::"yes","no"} Face pain: {Blank single:19197::"yes","no"} Toothache: {Blank single:19197::"yes","no"} Ear pain: {Blank single:19197::"yes","no"} {Blank single:19197::""right","left", "bilateral"} Ear pressure: {Blank single:19197::"yes","no"} {Blank single:19197::""right","left", "bilateral"} Eyes red/itching:{Blank single:19197::"yes","no"} Eye drainage/crusting: {Blank single:19197::"yes","no"}  Vomiting: {Blank single:19197::"yes","no"} Rash: {Blank single:19197::"yes","no"} Fatigue: {Blank single:19197::"yes","no"} Sick contacts: {Blank single:19197::"yes","no"} Strep contacts: {Blank single:19197::"yes","no"}  Context: {Blank multiple:19196::"better","worse","stable","fluctuating"} Recurrent sinusitis: {Blank single:19197::"yes","no"} Relief with OTC cold/cough medications: {Blank single:19197::"yes","no"}  Treatments attempted: {Blank  multiple:19196::"none","cold/sinus","mucinex","anti-histamine","pseudoephedrine","cough syrup","antibiotics"}    Relevant past medical, surgical, family and social history reviewed and updated as indicated. Interim medical history since our last visit reviewed. Allergies and medications reviewed and updated.  Review of Systems  Per HPI unless specifically indicated above     Objective:    Wt 227 lb 3.2 oz (103.1 kg)   BMI 31.91 kg/m   Wt Readings from Last 3 Encounters:  05/27/22 227 lb 3.2 oz (103.1 kg)  09/29/21 235 lb 3.7 oz (106.7 kg)  01/17/21 235 lb 3.2 oz (106.7 kg)    Physical Exam  Results for orders placed or performed in visit on 02/10/21  Cologuard  Result Value Ref Range   Cologuard Negative Negative      Assessment & Plan:   Problem List Items Addressed This Visit   None    Follow up plan: No follow-ups on file.

## 2022-05-27 NOTE — Patient Instructions (Signed)
Sore Throat When you have a sore throat, your throat may feel: Tender. Burning. Irritated. Scratchy. Painful when you swallow. Painful when you talk. Many things can cause a sore throat, such as: An infection. Allergies. Dry air. Smoke or pollution. Radiation treatment for cancer. Gastroesophageal reflux disease (GERD). A tumor. A sore throat can be the first sign of another sickness. It can happen with other problems, like: Coughing. Sneezing. Fever. Swelling of the glands in the neck. Most sore throats go away without treatment. Follow these instructions at home:     Medicines Take over-the-counter and prescription medicines only as told by your doctor. Children often get sore throats. Do not give your child aspirin. Use throat sprays to soothe your throat as told by your health care provider. Managing pain To help with pain: Sip warm liquids, such as broth, herbal tea, or warm water. Eat or drink cold or frozen liquids, such as frozen ice pops. Rinse your mouth (gargle) with a salt water mixture 3-4 times a day or as needed. To make salt water, dissolve -1 tsp (3-6 g) of salt in 1 cup (237 mL) of warm water. Do not swallow this mixture. Suck on hard candy or throat lozenges. Put a cool-mist humidifier in your bedroom at night. Sit in the bathroom with the door closed for 5-10 minutes while you run hot water in the shower. General instructions Do not smoke or use any products that contain nicotine or tobacco. If you need help quitting, ask your doctor. Get plenty of rest. Drink enough fluid to keep your pee (urine) pale yellow. Wash your hands often for at least 20 seconds with soap and water. If soap and water are not available, use hand sanitizer. Contact a doctor if: You have a fever for more than 2-3 days. You keep having symptoms for more than 2-3 days. Your throat does not get better in 7 days. You have a fever and your symptoms suddenly get worse. Your  child who is 3 months to 3 years old has a temperature of 102.2F (39C) or higher. Get help right away if: You have trouble breathing. You cannot swallow fluids, soft foods, or your spit. You have swelling in your throat or neck that gets worse. You feel like you may vomit (nauseous) and this feeling lasts a long time. You cannot stop vomiting. These symptoms may be an emergency. Get help right away. Call your local emergency services (911 in the [place] Do not wait to see if the symptoms will go away. Do not drive yourself to the hospital. Summary A sore throat is a painful, burning, irritated, or scratchy throat. Many things can cause a sore throat. Take over-the-counter medicines only as told by your doctor. Get plenty of rest. Drink enough fluid to keep your pee (urine) pale yellow. Contact a doctor if your symptoms get worse or your sore throat does not get better within 7 days. This information is not intended to replace advice given to you by your health care provider. Make sure you discuss any questions you have with your health care provider. Document Revised: 02/05/2021 Document Reviewed: 02/05/2021 Elsevier Patient Education  2023 Elsevier Inc.  

## 2022-05-27 NOTE — Assessment & Plan Note (Signed)
Ongoing for 4 weeks. Rapid strep test today negative. Recommend to take OTC Claritin or Allegra. Augmentin sent due to length of symptoms + viscous lidocaine for symptom management.  Recommend: - Increased rest - Increasing Fluids - Acetaminophen / ibuprofen as needed for fever/pain.  - Salt water gargling, chloraseptic spray and throat lozenges - Humidifying the air. Return to office for worsening or ongoing symptoms.

## 2022-05-29 LAB — RAPID STREP SCREEN (MED CTR MEBANE ONLY): Strep Gp A Ag, IA W/Reflex: NEGATIVE

## 2022-05-29 LAB — CULTURE, GROUP A STREP: Strep A Culture: NEGATIVE

## 2022-05-29 NOTE — Progress Notes (Signed)
Contacted via MyChart   Strep returned negative on culture:)

## 2022-09-25 ENCOUNTER — Ambulatory Visit (INDEPENDENT_AMBULATORY_CARE_PROVIDER_SITE_OTHER): Payer: BC Managed Care – PPO | Admitting: Nurse Practitioner

## 2022-09-25 ENCOUNTER — Encounter: Payer: Self-pay | Admitting: Nurse Practitioner

## 2022-09-25 VITALS — BP 130/86 | HR 74 | Temp 98.4°F | Wt 223.0 lb

## 2022-09-25 DIAGNOSIS — J069 Acute upper respiratory infection, unspecified: Secondary | ICD-10-CM | POA: Diagnosis not present

## 2022-09-25 DIAGNOSIS — M545 Low back pain, unspecified: Secondary | ICD-10-CM

## 2022-09-25 MED ORDER — CYCLOBENZAPRINE HCL 10 MG PO TABS: 10.0000 mg | ORAL_TABLET | Freq: Every evening | ORAL | 0 refills | Status: DC | PRN

## 2022-09-25 MED ORDER — AMOXICILLIN 500 MG PO CAPS: 500.0000 mg | ORAL_CAPSULE | Freq: Two times a day (BID) | ORAL | 0 refills | Status: AC

## 2022-09-25 MED ORDER — METHYLPREDNISOLONE 4 MG PO TBPK: ORAL_TABLET | ORAL | 0 refills | Status: DC

## 2022-09-25 MED ORDER — MELOXICAM 15 MG PO TABS: 15.0000 mg | ORAL_TABLET | Freq: Every day | ORAL | 0 refills | Status: DC

## 2022-09-25 NOTE — Assessment & Plan Note (Signed)
Chronic low back pain.  Has bene out of Mobic and Cyclobenzaprine.  Refilled today.  Advised patient not to take daily due risk of high blood pressure and kidney damage. Follow up in 1 month.  Call sooner if concerns arise.

## 2022-09-25 NOTE — Progress Notes (Signed)
BP 130/86   Pulse 74   Temp 98.4 F (36.9 C) (Oral)   Wt 223 lb (101.2 kg)   SpO2 98%   BMI 31.32 kg/m    Subjective:    Patient ID: Kevin Cobb, male    DOB: 1971/01/31, 51 y.o.   MRN: [id number]  HPI: Kevin Cobb is a 51 y.o. male  Chief Complaint  Patient presents with   Sinus Problem    Sinus pressure, post nasal drip, yellow like mucous, intermittent HA, burning eyes all symptoms ongoing x 3 weeks.    UPPER RESPIRATORY TRACT INFECTION Worst symptom: symptoms have bene ongoing x 3-4 weeks Fever: twice in the last couple of weeks Cough: yes Shortness of breath: no Wheezing: no Chest pain: no Chest tightness: no Chest congestion: yes Nasal congestion: yes Runny nose: yes Post nasal drip: yes Sneezing: yes Sore throat: no Swollen glands: no Sinus pressure: no Headache: yes Face pain: no Toothache: no Ear pain: no bilateral Ear pressure: no bilateral Eyes red/itching:yes Eye drainage/crusting: no  Vomiting: no Rash: no Fatigue: yes Sick contacts: no Strep contacts: no  Context: fluctuating Recurrent sinusitis: no Relief with OTC cold/cough medications: no  Treatments attempted: cold/sinus, mucinex, pseudoephedrine, and cough syrup   Relevant past medical, surgical, family and social history reviewed and updated as indicated. Interim medical history since our last visit reviewed. Allergies and medications reviewed and updated.  Review of Systems  Constitutional:  Positive for fatigue and fever.  HENT:  Positive for congestion, postnasal drip, rhinorrhea, sneezing and sore throat. Negative for ear pain, sinus pressure and sinus pain.   Respiratory:  Positive for cough. Negative for chest tightness, shortness of breath and wheezing.   Gastrointestinal:  Negative for vomiting.  Skin:  Negative for rash.  Neurological:  Positive for headaches.    Per HPI unless specifically indicated above     Objective:    BP 130/86   Pulse 74   Temp  98.4 F (36.9 C) (Oral)   Wt 223 lb (101.2 kg)   SpO2 98%   BMI 31.32 kg/m   Wt Readings from Last 3 Encounters:  09/25/22 223 lb (101.2 kg)  05/27/22 227 lb 3.2 oz (103.1 kg)  09/29/21 235 lb 3.7 oz (106.7 kg)    Physical Exam Vitals and nursing note reviewed.  Constitutional:      General: He is not in acute distress.    Appearance: Normal appearance. He is not ill-appearing, toxic-appearing or diaphoretic.  HENT:     Head: Normocephalic.     Right Ear: Tympanic membrane and external ear normal.     Left Ear: Tympanic membrane and external ear normal.     Nose: Nose normal. No congestion or rhinorrhea.     Right Sinus: No maxillary sinus tenderness or frontal sinus tenderness.     Left Sinus: No maxillary sinus tenderness or frontal sinus tenderness.     Mouth/Throat:     Mouth: Mucous membranes are moist.     Pharynx: Posterior oropharyngeal erythema present. No oropharyngeal exudate.  Eyes:     General:        Right eye: No discharge.        Left eye: No discharge.     Extraocular Movements: Extraocular movements intact.     Conjunctiva/sclera: Conjunctivae normal.     Pupils: Pupils are equal, round, and reactive to light.  Cardiovascular:     Rate and Rhythm: Normal rate and regular rhythm.     Heart  sounds: No murmur heard. Pulmonary:     Effort: Pulmonary effort is normal. No respiratory distress.     Breath sounds: Wheezing present. No rhonchi or rales.  Abdominal:     General: Abdomen is flat. Bowel sounds are normal.  Musculoskeletal:     Cervical back: Normal range of motion and neck supple.  Skin:    General: Skin is warm and dry.     Capillary Refill: Capillary refill takes less than 2 seconds.  Neurological:     General: No focal deficit present.     Mental Status: He is alert and oriented to person, place, and time.  Psychiatric:        Mood and Affect: Mood normal.        Behavior: Behavior normal.        Thought Content: Thought content normal.         Judgment: Judgment normal.     Results for orders placed or performed in visit on 05/27/22  Rapid Strep Screen ([facility] ONLY)   Specimen: Other   Other  Result Value Ref Range   Strep Gp A Ag, IA W/Reflex Negative Negative  Culture, Group A Strep   Other  Result Value Ref Range   Strep A Culture Negative       Assessment & Plan:   Problem List Items Addressed This Visit       Other   Back pain, lumbosacral    Chronic low back pain.  Has bene out of Mobic and Cyclobenzaprine.  Refilled today.  Advised patient not to take daily due risk of high blood pressure and kidney damage. Follow up in 1 month.  Call sooner if concerns arise.       Relevant Medications   meloxicam (MOBIC) 15 MG tablet   cyclobenzaprine (FLEXERIL) 10 MG tablet   methylPREDNISolone (MEDROL DOSEPAK) 4 MG TBPK tablet   Other Visit Diagnoses     Acute upper respiratory infection    -  Primary   Complete course of antibiotic and prednisone. Continue with OTC symptom management. Increase hydration. FU if symptoms not improved.        Follow up plan: Return in about 1 month (around 10/25/2022) for Physical and Fasting labs.

## 2022-10-27 NOTE — Progress Notes (Unsigned)
There were no vitals taken for this visit.   Subjective:    Patient ID: Kevin Cobb, male    DOB: [birth year], 51 y.o.   MRN: [id number]  HPI: Kevin Cobb is a 51 y.o. male presenting on 10/28/2022 for comprehensive medical examination. Current medical complaints include:{Blank single:19197::"none","***"}  He currently lives with: Interim Problems from his last visit: {Blank single:19197::"yes","no"}  HYPERLIPIDEMIA Hyperlipidemia status: {Blank single:19197::"excellent compliance","good compliance","fair compliance","poor compliance"} Satisfied with current treatment?  {Blank single:19197::"yes","no"} Side effects:  {Blank single:19197::"yes","no"} Medication compliance: {Blank single:19197::"excellent compliance","good compliance","fair compliance","poor compliance"} Past cholesterol meds: {Blank multiple:19196::"none","atorvastain (lipitor)","lovastatin (mevacor)","pravastatin (pravachol)","rosuvastatin (crestor)","simvastatin (zocor)","vytorin","fenofibrate (tricor)","gemfibrozil","ezetimide (zetia)","niaspan","lovaza"} Supplements: {Blank multiple:19196::"none","fish oil","niacin","red yeast rice"} Aspirin:  {Blank single:19197::"yes","no"} The 10-year ASCVD risk score (Arnett DK, et al., 2019) is: 14%   Values used to calculate the score:     Age: 40 years     Sex: Male     Is Non-Hispanic African American: No     Diabetic: No     Tobacco smoker: Yes     Systolic Blood Pressure: 130 mmHg     Is BP treated: No     HDL Cholesterol: 41 mg/dL     Total Cholesterol: 272 mg/dL Chest pain:  {Blank NWGNFA:21308::"MVH","QI"} Coronary artery disease:  {Blank single:19197::"yes","no"} Family history CAD:  {Blank single:19197::"yes","no"} Family history early CAD:  {Blank single:19197::"yes","no"}  Depression Screen done today and results listed below:     09/25/2022   11:31 AM 05/27/2022    3:14 PM 01/17/2021    8:10 AM 10/31/2018    2:21 PM 03/09/2017    8:46 AM   Depression screen PHQ 2/9  Decreased Interest 0 0 0 0 0  Down, Depressed, Hopeless 0 1 0 0 0  PHQ - 2 Score 0 1 0 0 0  Altered sleeping 0 0  0   Tired, decreased energy 1 1  0   Change in appetite 0 0  1   Feeling bad or failure about yourself  0 0  0   Trouble concentrating 0 0  0   Moving slowly or fidgety/restless 0 0  0   Suicidal thoughts 0 0  0   PHQ-9 Score 1 2  1    Difficult doing work/chores Not difficult at all Not difficult at all       The patient {has/does not have:19849} a history of falls. I {did/did not:19850} complete a risk assessment for falls. A plan of care for falls {was/was not:19852} documented.   Past Medical History:  Past Medical History:  Diagnosis Date   Back pain    Cataract    Hyperlipidemia    Tennis elbow     Surgical History:  Past Surgical History:  Procedure Laterality Date   CATARACT EXTRACTION     FRACTURE SURGERY     LEG SURGERY Right    childhood    Medications:  Current Outpatient Medications on File Prior to Visit  Medication Sig   cyclobenzaprine (FLEXERIL) 10 MG tablet Take 1 tablet (10 mg total) by mouth at bedtime as needed for muscle spasms.   meloxicam (MOBIC) 15 MG tablet Take 1 tablet (15 mg total) by mouth daily.   methylPREDNISolone (MEDROL DOSEPAK) 4 MG TBPK tablet Take as directed   No current facility-administered medications on file prior to visit.    Allergies:  No Known Allergies  Social History:  Social History   Socioeconomic History   Marital status: Married    Spouse name: Not on file   Number of  children: Not on file   Years of education: Not on file   Highest education level: Not on file  Occupational History   Not on file  Tobacco Use   Smoking status: Every Day    Packs/day: 1.00    Years: 25.00    Total pack years: 25.00    Types: Cigarettes   Smokeless tobacco: Never  Vaping Use   Vaping Use: Never used  Substance and Sexual Activity   Alcohol use: No    Alcohol/week: 0.0  standard drinks of alcohol   Drug use: No   Sexual activity: Yes  Other Topics Concern   Not on file  Social History Narrative   Not on file   Social Determinants of Health   Financial Resource Strain: Not on file  Food Insecurity: Not on file  Transportation Needs: Not on file  Physical Activity: Not on file  Stress: Not on file  Social Connections: Not on file  Intimate Partner Violence: Not on file   Social History   Tobacco Use  Smoking Status Every Day   Packs/day: 1.00   Years: 25.00   Total pack years: 25.00   Types: Cigarettes  Smokeless Tobacco Never   Social History   Substance and Sexual Activity  Alcohol Use No   Alcohol/week: 0.0 standard drinks of alcohol    Family History:  Family History  Problem Relation Age of Onset   Diabetes Mother    Kidney disease Mother    Hyperlipidemia Mother    Hypertension Mother    Brain cancer Father    Lung cancer Father    Deep vein thrombosis Sister    Factor V Leiden deficiency Sister    Lung cancer Paternal Aunt    Heart disease Maternal Grandmother    Diabetes Maternal Grandmother    Alzheimer's disease Paternal Grandmother    Melanoma Paternal Grandmother     Past medical history, surgical history, medications, allergies, family history and social history reviewed with patient today and changes made to appropriate areas of the chart.   ROS All other ROS negative except what is listed above and in the HPI.      Objective:    There were no vitals taken for this visit.  Wt Readings from Last 3 Encounters:  09/25/22 223 lb (101.2 kg)  05/27/22 227 lb 3.2 oz (103.1 kg)  09/29/21 235 lb 3.7 oz (106.7 kg)    Physical Exam  Results for orders placed or performed in visit on 05/27/22  Rapid Strep Screen ([facility] ONLY)   Specimen: Other   Other  Result Value Ref Range   Strep Gp A Ag, IA W/Reflex Negative Negative  Culture, Group A Strep   Other  Result Value Ref Range   Strep A Culture  Negative       Assessment & Plan:   Problem List Items Addressed This Visit       Other   Hypercholesteremia   Pre-diabetes - Primary     Discussed aspirin prophylaxis for myocardial infarction prevention and decision was {Blank single:19197::"it was not indicated","made to continue ASA","made to start ASA","made to stop ASA","that we recommended ASA, and patient refused"}  LABORATORY TESTING:  Health maintenance labs ordered today as discussed above.   The natural history of prostate cancer and ongoing controversy regarding screening and potential treatment outcomes of prostate cancer has been discussed with the patient. The meaning of a false positive PSA and a false negative PSA has been discussed. He indicates  understanding of the limitations of this screening test and wishes *** to proceed with screening PSA testing.   IMMUNIZATIONS:   - Tdap: Tetanus vaccination status reviewed: {tetanus status:315746}. - Influenza: {Blank single:19197::"Up to date","Administered today","Postponed to flu season","Refused","Given elsewhere"} - Pneumovax: {Blank single:19197::"Up to date","Administered today","Not applicable","Refused","Given elsewhere"} - Prevnar: {Blank single:19197::"Up to date","Administered today","Not applicable","Refused","Given elsewhere"} - COVID: {Blank single:19197::"Up to date","Administered today","Not applicable","Refused","Given elsewhere"} - HPV: {Blank single:19197::"Up to date","Administered today","Not applicable","Refused","Given elsewhere"} - Shingrix vaccine: {Blank single:19197::"Up to date","Administered today","Not applicable","Refused","Given elsewhere"}  SCREENING: - Colonoscopy: {Blank single:19197::"Up to date","Ordered today","Not applicable","Refused","Done elsewhere"}  Discussed with patient purpose of the colonoscopy is to detect colon cancer at curable precancerous or early stages   - AAA Screening: {Blank single:19197::"Up to date","Ordered  today","Not applicable","Refused","Done elsewhere"}  -Hearing Test: {Blank single:19197::"Up to date","Ordered today","Not applicable","Refused","Done elsewhere"}  -Spirometry: {Blank single:19197::"Up to date","Ordered today","Not applicable","Refused","Done elsewhere"}   PATIENT COUNSELING:    Sexuality: Discussed sexually transmitted diseases, partner selection, use of condoms, avoidance of unintended pregnancy  and contraceptive alternatives.   Advised to avoid cigarette smoking.  I discussed with the patient that most people either abstain from alcohol or drink within safe limits (<=14/week and <=4 drinks/occasion for males, <=7/weeks and <= 3 drinks/occasion for females) and that the risk for alcohol disorders and other health effects rises proportionally with the number of drinks per week and how often a drinker exceeds daily limits.  Discussed cessation/primary prevention of drug use and availability of treatment for abuse.   Diet: Encouraged to adjust caloric intake to maintain  or achieve ideal body weight, to reduce intake of dietary saturated fat and total fat, to limit sodium intake by avoiding high sodium foods and not adding table salt, and to maintain adequate dietary potassium and calcium preferably from fresh fruits, vegetables, and low-fat dairy products.    stressed the importance of regular exercise  Injury prevention: Discussed safety belts, safety helmets, smoke detector, smoking near bedding or upholstery.   Dental health: Discussed importance of regular tooth brushing, flossing, and dental visits.   Follow up plan: NEXT PREVENTATIVE PHYSICAL DUE IN 1 YEAR. No follow-ups on file.

## 2022-10-28 ENCOUNTER — Ambulatory Visit (INDEPENDENT_AMBULATORY_CARE_PROVIDER_SITE_OTHER): Payer: BC Managed Care – PPO | Admitting: Nurse Practitioner

## 2022-10-28 ENCOUNTER — Encounter: Payer: Self-pay | Admitting: Nurse Practitioner

## 2022-10-28 VITALS — BP 115/73 | HR 78 | Temp 98.6°F | Ht 71.0 in | Wt 220.0 lb

## 2022-10-28 DIAGNOSIS — E7800 Pure hypercholesterolemia, unspecified: Secondary | ICD-10-CM

## 2022-10-28 DIAGNOSIS — Z0000 Encounter for general adult medical examination without abnormal findings: Secondary | ICD-10-CM | POA: Diagnosis not present

## 2022-10-28 DIAGNOSIS — R7303 Prediabetes: Secondary | ICD-10-CM | POA: Diagnosis not present

## 2022-10-28 DIAGNOSIS — Z23 Encounter for immunization: Secondary | ICD-10-CM | POA: Diagnosis not present

## 2022-10-28 DIAGNOSIS — Z6830 Body mass index (BMI) 30.0-30.9, adult: Secondary | ICD-10-CM | POA: Diagnosis not present

## 2022-10-28 LAB — URINALYSIS, ROUTINE W REFLEX MICROSCOPIC
Bilirubin, UA: NEGATIVE
Glucose, UA: NEGATIVE
Ketones, UA: NEGATIVE
Leukocytes,UA: NEGATIVE
Nitrite, UA: NEGATIVE
Protein,UA: NEGATIVE
RBC, UA: NEGATIVE
Specific Gravity, UA: 1.025 (ref 1.005–1.030)
Urobilinogen, Ur: 0.2 mg/dL (ref 0.2–1.0)
pH, UA: 5.5 (ref 5.0–7.5)

## 2022-10-28 NOTE — Assessment & Plan Note (Signed)
Recommended eating smaller high protein, low fat meals more frequently and exercising 30 mins a day 5 times a week with a goal of 10-15lb weight loss in the next 3 months. Patient voiced their understanding and motivation to adhere to these recommendations.  

## 2022-10-28 NOTE — Assessment & Plan Note (Signed)
Labs ordered at visit today.  Will make recommendations based on lab results.   

## 2022-10-29 ENCOUNTER — Encounter: Payer: Self-pay | Admitting: Nurse Practitioner

## 2022-10-29 LAB — CBC WITH DIFFERENTIAL/PLATELET
Basophils Absolute: 0.1 10*3/uL (ref 0.0–0.2)
Basos: 1 %
EOS (ABSOLUTE): 0.1 10*3/uL (ref 0.0–0.4)
Eos: 1 %
Hematocrit: 45.8 % (ref 37.5–51.0)
Hemoglobin: 15.5 g/dL (ref 13.0–17.7)
Immature Grans (Abs): 0 10*3/uL (ref 0.0–0.1)
Immature Granulocytes: 0 %
Lymphocytes Absolute: 3.2 10*3/uL — ABNORMAL HIGH (ref 0.7–3.1)
Lymphs: 35 %
MCH: 31.3 pg (ref 26.6–33.0)
MCHC: 33.8 g/dL (ref 31.5–35.7)
MCV: 93 fL (ref 79–97)
Monocytes Absolute: 0.9 10*3/uL (ref 0.1–0.9)
Monocytes: 10 %
Neutrophils Absolute: 4.9 10*3/uL (ref 1.4–7.0)
Neutrophils: 53 %
Platelets: 221 10*3/uL (ref 150–450)
RBC: 4.95 x10E6/uL (ref 4.14–5.80)
RDW: 12.5 % (ref 11.6–15.4)
WBC: 9.2 10*3/uL (ref 3.4–10.8)

## 2022-10-29 LAB — COMPREHENSIVE METABOLIC PANEL
ALT: 17 IU/L (ref 0–44)
AST: 15 IU/L (ref 0–40)
Albumin/Globulin Ratio: 2.3 — ABNORMAL HIGH (ref 1.2–2.2)
Albumin: 5 g/dL (ref 4.1–5.1)
Alkaline Phosphatase: 65 IU/L (ref 44–121)
BUN/Creatinine Ratio: 14 (ref 9–20)
BUN: 13 mg/dL (ref 6–24)
Bilirubin Total: 0.6 mg/dL (ref 0.0–1.2)
CO2: 24 mmol/L (ref 20–29)
Calcium: 9.5 mg/dL (ref 8.7–10.2)
Chloride: 104 mmol/L (ref 96–106)
Creatinine, Ser: 0.92 mg/dL (ref 0.76–1.27)
Globulin, Total: 2.2 g/dL (ref 1.5–4.5)
Glucose: 104 mg/dL — ABNORMAL HIGH (ref 70–99)
Potassium: 4.3 mmol/L (ref 3.5–5.2)
Sodium: 141 mmol/L (ref 134–144)
Total Protein: 7.2 g/dL (ref 6.0–8.5)
eGFR: 102 mL/min/{1.73_m2} (ref >59)

## 2022-10-29 LAB — LIPID PANEL
Chol/HDL Ratio: 6.7 ratio — ABNORMAL HIGH (ref 0.0–5.0)
Cholesterol, Total: 241 mg/dL — ABNORMAL HIGH (ref 100–199)
HDL: 36 mg/dL — ABNORMAL LOW (ref >39)
LDL Chol Calc (NIH): 193 mg/dL — ABNORMAL HIGH (ref 0–99)
Triglycerides: 73 mg/dL (ref 0–149)
VLDL Cholesterol Cal: 12 mg/dL (ref 5–40)

## 2022-10-29 LAB — HEMOGLOBIN A1C
Est. average glucose Bld gHb Est-mCnc: 140 mg/dL
Hgb A1c MFr Bld: 6.5 % — ABNORMAL HIGH (ref 4.8–5.6)

## 2022-10-29 LAB — TSH: TSH: 1.26 u[IU]/mL (ref 0.450–4.500)

## 2022-10-29 NOTE — Progress Notes (Signed)
Please let patient know that his cholesterol is elevated.  His cardiac risk score puts him at high risk of having a stroke or heart attack over the next 10 years.  I recommend that he start a statin called crestor 5mg  daily.  The goal will be to increase this to 20mg  daily if patient tolerates it well.  If he agrees to the medication I can send it to the pharmacy.    He is also diabetic now.  His A1c is 6.5.  At this time, No medications are needed to manage this.  I recommend following a low carb diet and exercising.    No other concerns at this time.  The 10-year ASCVD risk score (Arnett DK, et al., 2019) is: 11%   Values used to calculate the score:     Age: 51 years     Sex: Male     Is Non-Hispanic African American: No     Diabetic: No     Tobacco smoker: Yes     Systolic Blood Pressure: 115 mmHg     Is BP treated: No     HDL Cholesterol: 36 mg/dL     Total Cholesterol: 241 mg/dL

## 2022-10-30 MED ORDER — ROSUVASTATIN CALCIUM 5 MG PO TABS: 5.0000 mg | ORAL_TABLET | Freq: Every day | ORAL | 1 refills | Status: DC

## 2022-11-25 ENCOUNTER — Other Ambulatory Visit: Payer: Self-pay | Admitting: Nurse Practitioner

## 2022-11-26 NOTE — Telephone Encounter (Signed)
Requested medication (s) are due for refill today - yes  Requested medication (s) are on the active medication list -yes  Future visit scheduled -no  Last refill: 09/25/22 #30  Notes to clinic: non delegated Rx  Requested Prescriptions  Pending Prescriptions Disp Refills   cyclobenzaprine (FLEXERIL) 10 MG tablet [Pharmacy Med Name: CYCLOBENZAPRINE 10 MG TABLET] 30 tablet 0    Sig: TAKE 1 TABLET BY MOUTH AT BEDTIME AS NEEDED FOR MUSCLE SPASMS     Not Delegated - Analgesics:  Muscle Relaxants Failed - 11/25/2022  5:26 PM      Failed - This refill cannot be delegated      Passed - Valid encounter within last 6 months    Recent Outpatient Visits           4 weeks ago Annual physical exam   [facility] Jon Billings, NP   2 months ago Acute upper respiratory infection   [facility] Jon Billings, NP   6 months ago Sore throat   [place], Henrine Screws T, NP   1 year ago Routine general medical examination at a health care facility   [place], [place], DO   2 years ago [place], [place], [place]       Future Appointments             In 5 months Jon Billings, NP [place], [place]               Requested Prescriptions  Pending Prescriptions Disp Refills   cyclobenzaprine (FLEXERIL) 10 MG tablet [Pharmacy Med Name: CYCLOBENZAPRINE 10 MG TABLET] 30 tablet 0    Sig: TAKE 1 TABLET BY MOUTH AT BEDTIME AS NEEDED FOR MUSCLE SPASMS     Not Delegated - Analgesics:  Muscle Relaxants Failed - 11/25/2022  5:26 PM      Failed - This refill cannot be delegated      Passed - Valid encounter within last 6 months    Recent Outpatient Visits           4 weeks ago Annual physical exam   [facility] Jon Billings, NP   2 months ago Acute upper respiratory infection   [facility] Jon Billings, NP   6 months ago  Sore throat   [place], Henrine Screws T, NP   1 year ago Routine general medical examination at a health care facility   [place], [place], DO   2 years ago [place], [place], [place]       Future Appointments             In 5 months Jon Billings, NP [facility], [place]

## 2022-11-28 ENCOUNTER — Other Ambulatory Visit: Payer: Self-pay | Admitting: Nurse Practitioner

## 2022-11-30 NOTE — Telephone Encounter (Signed)
Requested Prescriptions  Pending Prescriptions Disp Refills   meloxicam (MOBIC) 15 MG tablet [Pharmacy Med Name: MELOXICAM 15 MG TABLET] 90 tablet 0    Sig: TAKE 1 TABLET (15 MG TOTAL) BY MOUTH DAILY.     Analgesics:  COX2 Inhibitors Failed - 11/28/2022 12:35 PM      Failed - Manual Review: Labs are only required if the patient has taken medication for more than 8 weeks.      Passed - HGB in normal range and within 360 days    Hemoglobin  Date Value Ref Range Status  10/28/2022 15.5 13.0 - 17.7 g/dL Final         Passed - Cr in normal range and within 360 days    Creatinine, Ser  Date Value Ref Range Status  10/28/2022 0.92 0.76 - 1.27 mg/dL Final         Passed - HCT in normal range and within 360 days    Hematocrit  Date Value Ref Range Status  10/28/2022 45.8 37.5 - 51.0 % Final         Passed - AST in normal range and within 360 days    AST  Date Value Ref Range Status  10/28/2022 15 0 - 40 IU/L Final         Passed - ALT in normal range and within 360 days    ALT  Date Value Ref Range Status  10/28/2022 17 0 - 44 IU/L Final         Passed - eGFR is 30 or above and within 360 days    GFR calc Af Amer  Date Value Ref Range Status  01/17/2021 110 >59 mL/min/1.73 Final    Comment:    **In accordance with recommendations from the NKF-ASN Task force,**   [facility] is in the process of updating its eGFR calculation to the   2021 CKD-EPI creatinine equation that estimates kidney function   without a race variable.    GFR calc non Af Amer  Date Value Ref Range Status  01/17/2021 95 >59 mL/min/1.73 Final   eGFR  Date Value Ref Range Status  10/28/2022 102 >59 mL/min/1.73 Final         Passed - Patient is not pregnant      Passed - Valid encounter within last 12 months    Recent Outpatient Visits           1 month ago Annual physical exam   [place], NP   2 months ago Acute upper respiratory infection   [facility] Jon Billings, NP   6 months ago Sore throat   [place], Henrine Screws T, NP   1 year ago Routine general medical examination at a health care facility   [place], [place], DO   2 years ago [place], [place], [place]       Future Appointments             In 5 months Jon Billings, NP [facility], [place]

## 2023-02-03 ENCOUNTER — Ambulatory Visit (INDEPENDENT_AMBULATORY_CARE_PROVIDER_SITE_OTHER): Payer: BC Managed Care – PPO | Admitting: Nurse Practitioner

## 2023-02-03 ENCOUNTER — Ambulatory Visit
Admission: RE | Admit: 2023-02-03 | Discharge: 2023-02-03 | Disposition: A | Discharge status: Home / Self Care | Payer: BC Managed Care – PPO | Source: Ambulatory Visit | Attending: Nurse Practitioner | Admitting: Nurse Practitioner

## 2023-02-03 ENCOUNTER — Ambulatory Visit
Admission: RE | Admit: 2023-02-03 | Discharge: 2023-02-03 | Disposition: A | Discharge status: Home / Self Care | Payer: BC Managed Care – PPO | Attending: Nurse Practitioner | Admitting: Nurse Practitioner

## 2023-02-03 ENCOUNTER — Encounter: Payer: Self-pay | Admitting: Nurse Practitioner

## 2023-02-03 VITALS — BP 123/83 | HR 90 | Temp 98.2°F | Wt 224.3 lb

## 2023-02-03 DIAGNOSIS — M79672 Pain in left foot: Secondary | ICD-10-CM | POA: Diagnosis not present

## 2023-02-03 MED ORDER — PREDNISONE 10 MG PO TABS: 10.0000 mg | ORAL_TABLET | Freq: Every day | ORAL | 0 refills | Status: DC

## 2023-02-03 NOTE — Progress Notes (Unsigned)
BP 123/83   Pulse 90   Temp 98.2 F (36.8 C) (Oral)   Wt 224 lb 4.8 oz (101.7 kg)   SpO2 98%   BMI 31.28 kg/m    Subjective:    Patient ID: Kevin Cobb, male    DOB: 04/02/1971, 52 y.o.   MRN: [id number]  HPI: Kevin Cobb is a 52 y.o. male  Chief Complaint  Patient presents with   Foot Pain    Pt states he has been having pain in both of his heels for the last few months. States the pain is gradually getting worse, left is hurting worse than the right. States that the pain is the worst first thing in the morning and really only hurt when he is on his feet.    FOOT PAIN Duration: months Involved foot: bilateral- worse on the left side Mechanism of injury: unknown Location: heels Onset: gradual  Severity: 9/10 - when walking- when he gets up in the morning the pain is at its worst.  After about half the day the pain goes down to 5-6/10 Quality:  sharp Frequency: constant Radiation: no Aggravating factors: weight bearing and walking  Alleviating factors: NSAIDs - didn't really help Status: worse Treatments attempted: ibuprofen  Relief with NSAIDs?:  no Weakness with weight bearing or walking: no Morning stiffness: no Swelling: no Redness: no Bruising: no Paresthesias / decreased sensation: no  Fevers:no   Relevant past medical, surgical, family and social history reviewed and updated as indicated. Interim medical history since our last visit reviewed. Allergies and medications reviewed and updated.  Review of Systems  Per HPI unless specifically indicated above     Objective:    BP 123/83   Pulse 90   Temp 98.2 F (36.8 C) (Oral)   Wt 224 lb 4.8 oz (101.7 kg)   SpO2 98%   BMI 31.28 kg/m   Wt Readings from Last 3 Encounters:  02/03/23 224 lb 4.8 oz (101.7 kg)  10/28/22 220 lb (99.8 kg)  09/25/22 223 lb (101.2 kg)    Physical Exam  Results for orders placed or performed in visit on 10/28/22  TSH  Result Value Ref Range   TSH 1.260 0.450  - 4.500 uIU/mL  Lipid panel  Result Value Ref Range   Cholesterol, Total 241 (H) 100 - 199 mg/dL   Triglycerides 73 0 - 149 mg/dL   HDL 36 (L) >39 mg/dL   VLDL Cholesterol Cal 12 5 - 40 mg/dL   LDL Chol Calc (NIH) 193 (H) 0 - 99 mg/dL   Lipid Comment: Comment    Chol/HDL Ratio 6.7 (H) 0.0 - 5.0 ratio  CBC with Differential/Platelet  Result Value Ref Range   WBC 9.2 3.4 - 10.8 x10E3/uL   RBC 4.95 4.14 - 5.80 x10E6/uL   Hemoglobin 15.5 13.0 - 17.7 g/dL   Hematocrit 45.8 37.5 - 51.0 %   MCV 93 79 - 97 fL   MCH 31.3 26.6 - 33.0 pg   MCHC 33.8 31.5 - 35.7 g/dL   RDW 12.5 11.6 - 15.4 %   Platelets 221 150 - 450 x10E3/uL   Neutrophils 53 Not Estab. %   Lymphs 35 Not Estab. %   Monocytes 10 Not Estab. %   Eos 1 Not Estab. %   Basos 1 Not Estab. %   Neutrophils Absolute 4.9 1.4 - 7.0 x10E3/uL   Lymphocytes Absolute 3.2 (H) 0.7 - 3.1 x10E3/uL   Monocytes Absolute 0.9 0.1 - 0.9 x10E3/uL  EOS (ABSOLUTE) 0.1 0.0 - 0.4 x10E3/uL   Basophils Absolute 0.1 0.0 - 0.2 x10E3/uL   Immature Granulocytes 0 Not Estab. %   Immature Grans (Abs) 0.0 0.0 - 0.1 x10E3/uL  Comprehensive metabolic panel  Result Value Ref Range   Glucose 104 (H) 70 - 99 mg/dL   BUN 13 6 - 24 mg/dL   Creatinine, Ser 0.92 0.76 - 1.27 mg/dL   eGFR 102 >59 mL/min/1.73   BUN/Creatinine Ratio 14 9 - 20   Sodium 141 134 - 144 mmol/L   Potassium 4.3 3.5 - 5.2 mmol/L   Chloride 104 96 - 106 mmol/L   CO2 24 20 - 29 mmol/L   Calcium 9.5 8.7 - 10.2 mg/dL   Total Protein 7.2 6.0 - 8.5 g/dL   Albumin 5.0 4.1 - 5.1 g/dL   Globulin, Total 2.2 1.5 - 4.5 g/dL   Albumin/Globulin Ratio 2.3 (H) 1.2 - 2.2   Bilirubin Total 0.6 0.0 - 1.2 mg/dL   Alkaline Phosphatase 65 44 - 121 IU/L   AST 15 0 - 40 IU/L   ALT 17 0 - 44 IU/L  Urinalysis, Routine w reflex microscopic  Result Value Ref Range   Specific Gravity, UA 1.025 1.005 - 1.030   pH, UA 5.5 5.0 - 7.5   Color, UA Yellow Yellow   Appearance Ur Clear Clear   Leukocytes,UA  Negative Negative   Protein,UA Negative Negative/Trace   Glucose, UA Negative Negative   Ketones, UA Negative Negative   RBC, UA Negative Negative   Bilirubin, UA Negative Negative   Urobilinogen, Ur 0.2 0.2 - 1.0 mg/dL   Nitrite, UA Negative Negative   Microscopic Examination Comment   HgB A1c  Result Value Ref Range   Hgb A1c MFr Bld 6.5 (H) 4.8 - 5.6 %   Est. average glucose Bld gHb Est-mCnc 140 mg/dL      Assessment & Plan:   Problem List Items Addressed This Visit   None    Follow up plan: No follow-ups on file.

## 2023-02-08 NOTE — Progress Notes (Signed)
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  He does also have a heel spur.  If his foot pain continues after the prednisone, I recommend he see podiatry.  I can place the referral if needed.

## 2023-03-15 ENCOUNTER — Other Ambulatory Visit: Payer: Self-pay | Admitting: Nurse Practitioner

## 2023-03-15 NOTE — Telephone Encounter (Signed)
Requested Prescriptions  Pending Prescriptions Disp Refills   meloxicam (MOBIC) 15 MG tablet [Pharmacy Med Name: MELOXICAM 15 MG TABLET] 90 tablet 1    Sig: TAKE 1 TABLET (15 MG TOTAL) BY MOUTH DAILY.     Analgesics:  COX2 Inhibitors Failed - 03/15/2023  1:33 AM      Failed - Manual Review: Labs are only required if the patient has taken medication for more than 8 weeks.      Passed - HGB in normal range and within 360 days    Hemoglobin  Date Value Ref Range Status  10/28/2022 15.5 13.0 - 17.7 g/dL Final         Passed - Cr in normal range and within 360 days    Creatinine, Ser  Date Value Ref Range Status  10/28/2022 0.92 0.76 - 1.27 mg/dL Final         Passed - HCT in normal range and within 360 days    Hematocrit  Date Value Ref Range Status  10/28/2022 45.8 37.5 - 51.0 % Final         Passed - AST in normal range and within 360 days    AST  Date Value Ref Range Status  10/28/2022 15 0 - 40 IU/L Final         Passed - ALT in normal range and within 360 days    ALT  Date Value Ref Range Status  10/28/2022 17 0 - 44 IU/L Final         Passed - eGFR is 30 or above and within 360 days    GFR calc Af Amer  Date Value Ref Range Status  01/17/2021 110 >59 mL/min/1.73 Final    Comment:    **In accordance with recommendations from the NKF-ASN Task force,**   [facility] is in the process of updating its eGFR calculation to the   2021 CKD-EPI creatinine equation that estimates kidney function   without a race variable.    GFR calc non Af Amer  Date Value Ref Range Status  01/17/2021 95 >59 mL/min/1.73 Final   eGFR  Date Value Ref Range Status  10/28/2022 102 >59 mL/min/1.73 Final         Passed - Patient is not pregnant      Passed - Valid encounter within last 12 months    Recent Outpatient Visits           1 month ago Left foot pain   [place] [facility] Larae Grooms, NP   4 months ago Annual physical exam   [place] [facility] Larae Grooms, NP   5 months ago Acute upper respiratory infection   [place] [facility] Larae Grooms, NP   9 months ago Sore throat   [place] [facility], Corrie Dandy T, NP   2 years ago Routine general medical examination at a health care facility   [facility] Dorcas Carrow, DO       Future Appointments             In 1 month Larae Grooms, NP [place] [facility]

## 2023-03-16 ENCOUNTER — Other Ambulatory Visit: Payer: Self-pay | Admitting: Nurse Practitioner

## 2023-03-17 MED ORDER — CYCLOBENZAPRINE HCL 10 MG PO TABS: 10.0000 mg | ORAL_TABLET | Freq: Every day | ORAL | 0 refills | Status: DC

## 2023-03-17 NOTE — Telephone Encounter (Signed)
Requested medication (s) are due for refill today: yes  Requested medication (s) are on the active medication list: yes    Last refill: 11/26/22  #30  0 refills  Future visit scheduled yes 04/29/23  Notes to clinic:Not delegated, please review. Thank you.  Requested Prescriptions  Pending Prescriptions Disp Refills   cyclobenzaprine (FLEXERIL) 10 MG tablet [Pharmacy Med Name: CYCLOBENZAPRINE 10 MG TABLET] 30 tablet 0    Sig: TAKE 1 TABLET BY MOUTH AT BEDTIME AS NEEDED FOR MUSCLE SPASMS     Not Delegated - Analgesics:  Muscle Relaxants Failed - 03/16/2023  6:12 PM      Failed - This refill cannot be delegated      Passed - Valid encounter within last 6 months    Recent Outpatient Visits           1 month ago Left foot pain   [place] [facility] Larae Grooms, NP   4 months ago Annual physical exam   [place] [facility] Larae Grooms, NP   5 months ago Acute upper respiratory infection   [place] [facility] Larae Grooms, NP   9 months ago Sore throat   [place] [facility] [place], Corrie Dandy T, NP   2 years ago Routine general medical examination at a health care facility   [facility] Dorcas Carrow, DO       Future Appointments             In 1 month Larae Grooms, NP [place] [facility]

## 2023-04-28 IMAGING — CR DG KNEE COMPLETE 4+V*L*
4 series · 4 of 4 positions shown · non-contrast
Comparison: None.

CLINICAL DATA: Status post fall.  Knee pain.

EXAM:
LEFT KNEE - COMPLETE 4+ VIEW

[knee ap]
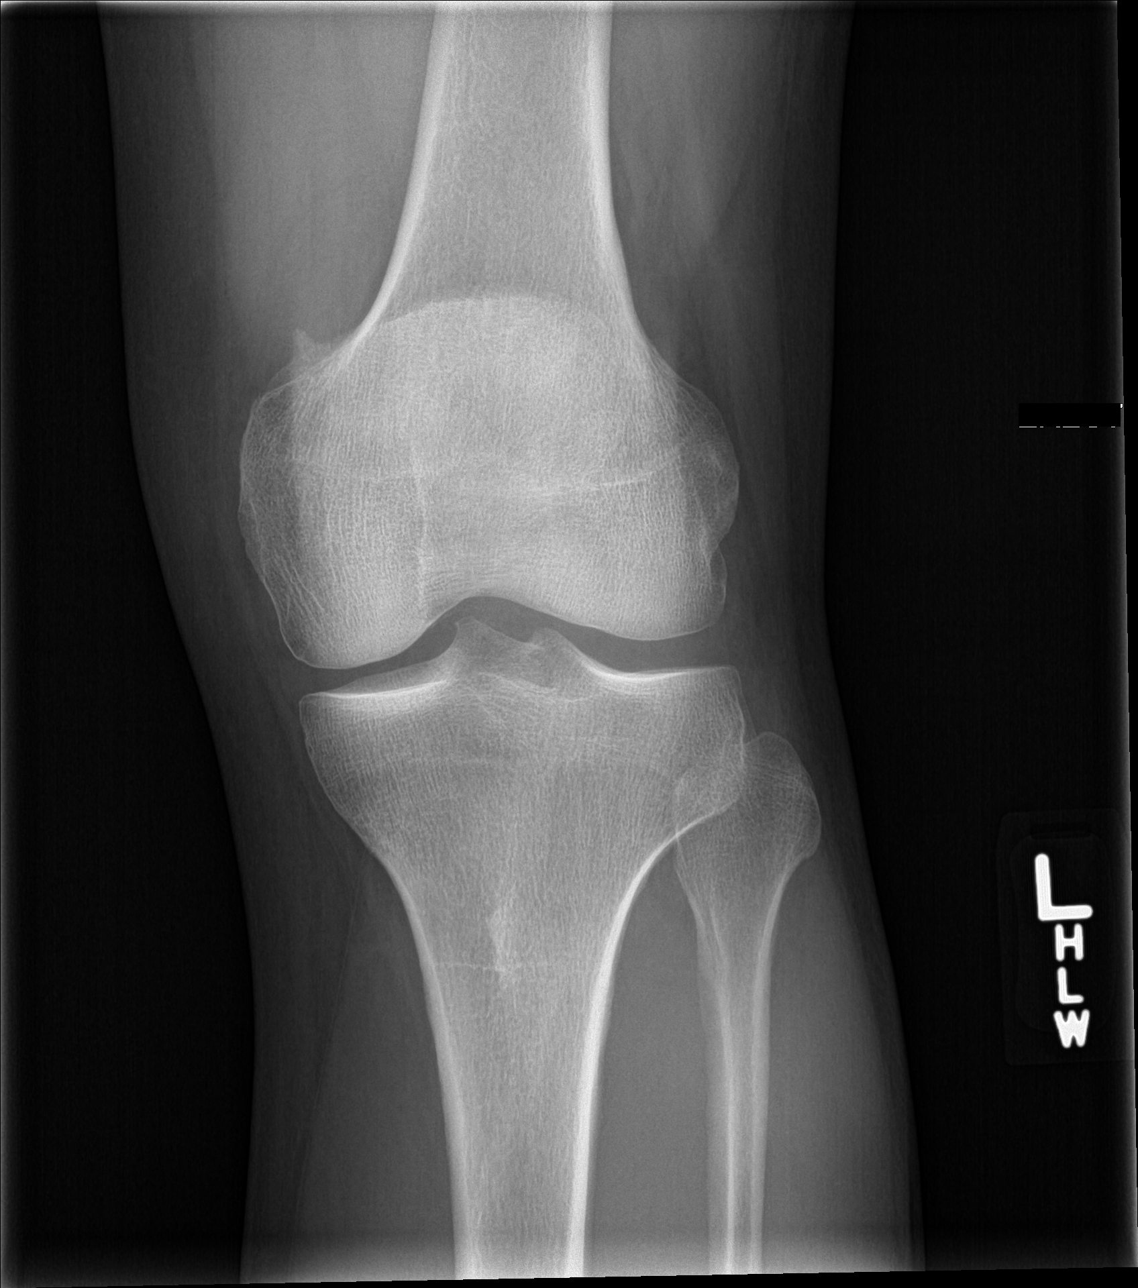

[knee lat]
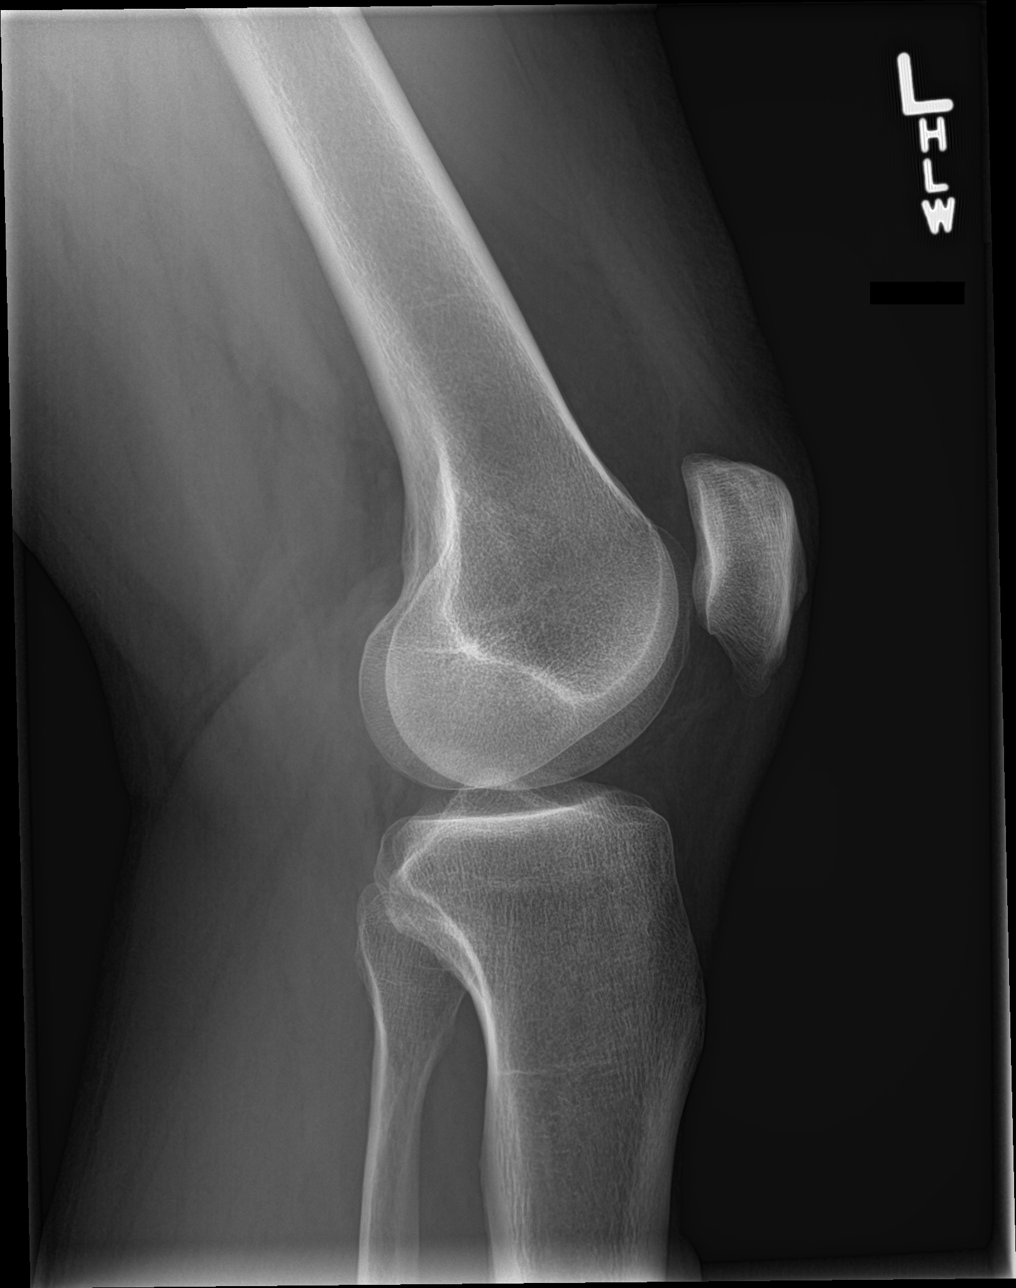

[tunnel]
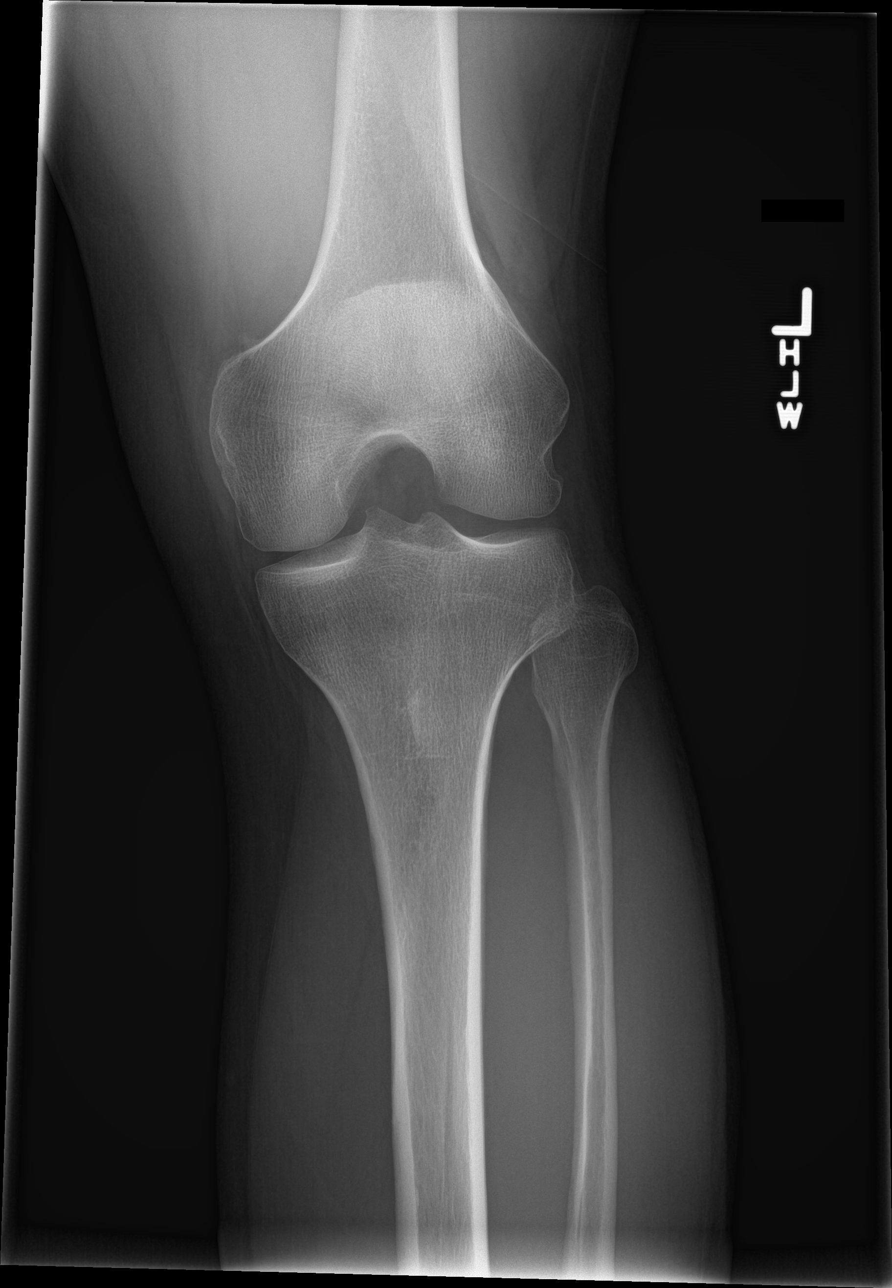

[patella skyline]
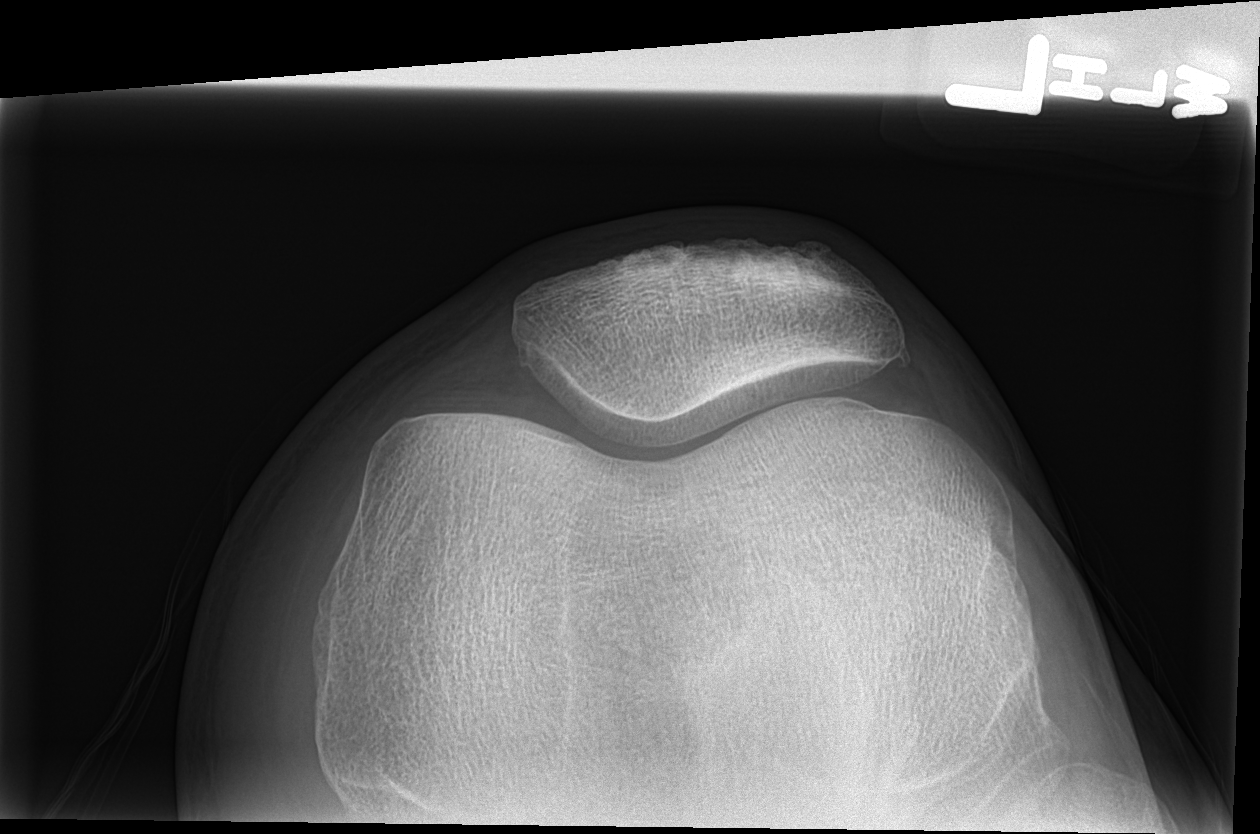

[4 of 4 positions shown; findings below may reference images not displayed]

FINDINGS: No joint effusion identified. No fracture or dislocation. Small
exostosis is identified arising off the medial aspect of the distal
femur.
IMPRESSION: 1. No acute findings.
2. Small exostosis arising off the medial aspect of the distal
femur.

## 2023-04-29 ENCOUNTER — Ambulatory Visit: Payer: BC Managed Care – PPO | Admitting: Nurse Practitioner

## 2023-05-31 ENCOUNTER — Other Ambulatory Visit: Payer: Self-pay | Admitting: Nurse Practitioner

## 2023-06-01 NOTE — Telephone Encounter (Signed)
Requested medication (s) are due for refill today: yes  Requested medication (s) are on the active medication list:yes  Last refill:  03/17/23 #30  Future visit scheduled: no  Notes to clinic:  med not delegated to NT to RF    Requested Prescriptions  Pending Prescriptions Disp Refills   cyclobenzaprine (FLEXERIL) 10 MG tablet [Pharmacy Med Name: CYCLOBENZAPRINE 10 MG TABLET] 30 tablet     Sig: TAKE 1 TABLET BY MOUTH EVERYDAY AT BEDTIME     Not Delegated - Analgesics:  Muscle Relaxants Failed - 05/31/2023  8:12 PM      Failed - This refill cannot be delegated      Passed - Valid encounter within last 6 months    Recent Outpatient Visits           3 months ago Left foot pain   [facility] Larae Grooms, NP   7 months ago Annual physical exam   [place] [facility] Larae Grooms, NP   8 months ago Acute upper respiratory infection   [place] [facility] Larae Grooms, NP   1 year ago Sore throat   [place] [facility] [place], Corrie Dandy T, NP   2 years ago Routine general medical examination at a health care facility   [place] [place] P, DO              Signed Prescriptions Disp Refills   rosuvastatin (CRESTOR) 5 MG tablet 90 tablet 1    Sig: TAKE 1 TABLET (5 MG TOTAL) BY MOUTH DAILY.     Cardiovascular:  Antilipid - Statins 2 Failed - 05/31/2023  8:12 PM      Failed - Lipid Panel in normal range within the last 12 months    Cholesterol, Total  Date Value Ref Range Status  10/28/2022 241 (H) 100 - 199 mg/dL Final   LDL Chol Calc (NIH)  Date Value Ref Range Status  10/28/2022 193 (H) 0 - 99 mg/dL Final   HDL  Date Value Ref Range Status  10/28/2022 36 (L) >39 mg/dL Final   Triglycerides  Date Value Ref Range Status  10/28/2022 73 0 - 149 mg/dL Final         Passed - Cr in normal range and within 360 days    Creatinine, Ser  Date Value Ref  Range Status  10/28/2022 0.92 0.76 - 1.27 mg/dL Final         Passed - Patient is not pregnant      Passed - Valid encounter within last 12 months    Recent Outpatient Visits           3 months ago Left foot pain   [place] [facility] Larae Grooms, NP   7 months ago Annual physical exam   [place] [facility] Larae Grooms, NP   8 months ago Acute upper respiratory infection   [place] [facility] Larae Grooms, NP   1 year ago Sore throat    [facility] [place], Corrie Dandy T, NP   2 years ago Routine general medical examination at a health care facility   [facility], [place], DO

## 2023-06-01 NOTE — Telephone Encounter (Signed)
Requested Prescriptions  Pending Prescriptions Disp Refills   cyclobenzaprine (FLEXERIL) 10 MG tablet [Pharmacy Med Name: CYCLOBENZAPRINE 10 MG TABLET] 30 tablet     Sig: TAKE 1 TABLET BY MOUTH EVERYDAY AT BEDTIME     Not Delegated - Analgesics:  Muscle Relaxants Failed - 05/31/2023  8:12 PM      Failed - This refill cannot be delegated      Passed - Valid encounter within last 6 months    Recent Outpatient Visits           3 months ago Left foot pain   [place] [facility] Larae Grooms, NP   7 months ago Annual physical exam   [place] [facility] Larae Grooms, NP   8 months ago Acute upper respiratory infection   [place] [facility] Larae Grooms, NP   1 year ago Sore throat   [place] [facility] [place], Corrie Dandy T, NP   2 years ago Routine general medical examination at a health care facility   [facility], Megan P, DO               rosuvastatin (CRESTOR) 5 MG tablet [Pharmacy Med Name: ROSUVASTATIN CALCIUM 5 MG TAB] 90 tablet 1    Sig: TAKE 1 TABLET (5 MG TOTAL) BY MOUTH DAILY.     Cardiovascular:  Antilipid - Statins 2 Failed - 05/31/2023  8:12 PM      Failed - Lipid Panel in normal range within the last 12 months    Cholesterol, Total  Date Value Ref Range Status  10/28/2022 241 (H) 100 - 199 mg/dL Final   LDL Chol Calc (NIH)  Date Value Ref Range Status  10/28/2022 193 (H) 0 - 99 mg/dL Final   HDL  Date Value Ref Range Status  10/28/2022 36 (L) >39 mg/dL Final   Triglycerides  Date Value Ref Range Status  10/28/2022 73 0 - 149 mg/dL Final         Passed - Cr in normal range and within 360 days    Creatinine, Ser  Date Value Ref Range Status  10/28/2022 0.92 0.76 - 1.27 mg/dL Final         Passed - Patient is not pregnant      Passed - Valid encounter within last 12 months    Recent Outpatient Visits           3 months ago Left foot pain    [place] [facility] Larae Grooms, NP   7 months ago Annual physical exam   [place] [facility] Larae Grooms, NP   8 months ago Acute upper respiratory infection   [place] [facility] Larae Grooms, NP   1 year ago Sore throat   [place] [facility] [place], Corrie Dandy T, NP   2 years ago Routine general medical examination at a health care facility   [facility] [place], DO

## 2023-09-05 ENCOUNTER — Other Ambulatory Visit: Payer: Self-pay | Admitting: Nurse Practitioner

## 2023-09-06 NOTE — Telephone Encounter (Signed)
Patient will need to schedule an office visit for further refills Requested Prescriptions  Pending Prescriptions Disp Refills   meloxicam (MOBIC) 15 MG tablet [Pharmacy Med Name: MELOXICAM 15 MG TABLET] 90 tablet 0    Sig: TAKE 1 TABLET (15 MG TOTAL) BY MOUTH DAILY.     Analgesics:  COX2 Inhibitors Failed - 09/05/2023  8:44 AM      Failed - Manual Review: Labs are only required if the patient has taken medication for more than 8 weeks.      Passed - HGB in normal range and within 360 days    Hemoglobin  Date Value Ref Range Status  10/28/2022 15.5 13.0 - 17.7 g/dL Final         Passed - Cr in normal range and within 360 days    Creatinine, Ser  Date Value Ref Range Status  10/28/2022 0.92 0.76 - 1.27 mg/dL Final         Passed - HCT in normal range and within 360 days    Hematocrit  Date Value Ref Range Status  10/28/2022 45.8 37.5 - 51.0 % Final         Passed - AST in normal range and within 360 days    AST  Date Value Ref Range Status  10/28/2022 15 0 - 40 IU/L Final         Passed - ALT in normal range and within 360 days    ALT  Date Value Ref Range Status  10/28/2022 17 0 - 44 IU/L Final         Passed - eGFR is 30 or above and within 360 days    GFR calc Af Amer  Date Value Ref Range Status  01/17/2021 110 >59 mL/min/1.73 Final    Comment:    **In accordance with recommendations from the NKF-ASN Task force,**   [facility] is in the process of updating its eGFR calculation to the   2021 CKD-EPI creatinine equation that estimates kidney function   without a race variable.    GFR calc non Af Amer  Date Value Ref Range Status  01/17/2021 95 >59 mL/min/1.73 Final   eGFR  Date Value Ref Range Status  10/28/2022 102 >59 mL/min/1.73 Final         Passed - Patient is not pregnant      Passed - Valid encounter within last 12 months    Recent Outpatient Visits           7 months ago Left foot pain   [place] [facility] Larae Grooms, NP    10 months ago Annual physical exam   [facility] Larae Grooms, NP   11 months ago Acute upper respiratory infection   [place] [facility] Larae Grooms, NP   1 year ago Sore throat   [place] [facility] [place], Corrie Dandy T, NP   2 years ago Routine general medical examination at a health care facility   [facility] [place], [place]

## 2023-12-10 ENCOUNTER — Other Ambulatory Visit: Payer: Self-pay | Admitting: Nurse Practitioner

## 2023-12-10 NOTE — Telephone Encounter (Signed)
Requested Prescriptions  Pending Prescriptions Disp Refills   rosuvastatin (CRESTOR) 5 MG tablet [Pharmacy Med Name: ROSUVASTATIN CALCIUM 5 MG TAB] 90 tablet 0    Sig: TAKE 1 TABLET (5 MG TOTAL) BY MOUTH DAILY.     Cardiovascular:  Antilipid - Statins 2 Failed - 12/10/2023  9:08 AM      Failed - Cr in normal range and within 360 days    Creatinine, Ser  Date Value Ref Range Status  10/28/2022 0.92 0.76 - 1.27 mg/dL Final         Failed - Lipid Panel in normal range within the last 12 months    Cholesterol, Total  Date Value Ref Range Status  10/28/2022 241 (H) 100 - 199 mg/dL Final   LDL Chol Calc (NIH)  Date Value Ref Range Status  10/28/2022 193 (H) 0 - 99 mg/dL Final   HDL  Date Value Ref Range Status  10/28/2022 36 (L) >39 mg/dL Final   Triglycerides  Date Value Ref Range Status  10/28/2022 73 0 - 149 mg/dL Final         Passed - Patient is not pregnant      Passed - Valid encounter within last 12 months    Recent Outpatient Visits           10 months ago Left foot pain   [place] [facility] Larae Grooms, NP   1 year ago Annual physical exam   [place] [facility] Larae Grooms, NP   1 year ago Acute upper respiratory infection   [place] [facility] Larae Grooms, NP   1 year ago Sore throat   [place] [facility] [place], Corrie Dandy T, NP   2 years ago Routine general medical examination at a health care facility   [facility], [place] P, DO               meloxicam (MOBIC) 15 MG tablet [Pharmacy Med Name: MELOXICAM 15 MG TABLET] 90 tablet 0    Sig: TAKE 1 TABLET (15 MG TOTAL) BY MOUTH DAILY.     Analgesics:  COX2 Inhibitors Failed - 12/10/2023  9:08 AM      Failed - Manual Review: Labs are only required if the patient has taken medication for more than 8 weeks.      Failed - HGB in normal range and within 360 days    Hemoglobin  Date Value Ref  Range Status  10/28/2022 15.5 13.0 - 17.7 g/dL Final         Failed - Cr in normal range and within 360 days    Creatinine, Ser  Date Value Ref Range Status  10/28/2022 0.92 0.76 - 1.27 mg/dL Final         Failed - HCT in normal range and within 360 days    Hematocrit  Date Value Ref Range Status  10/28/2022 45.8 37.5 - 51.0 % Final         Failed - AST in normal range and within 360 days    AST  Date Value Ref Range Status  10/28/2022 15 0 - 40 IU/L Final         Failed - ALT in normal range and within 360 days    ALT  Date Value Ref Range Status  10/28/2022 17 0 - 44 IU/L Final         Failed - eGFR is 30 or above and within 360 days  GFR calc Af Amer  Date Value Ref Range Status  01/17/2021 110 >59 mL/min/1.73 Final    Comment:    **In accordance with recommendations from the NKF-ASN Task force,**   [facility] is in the process of updating its eGFR calculation to the   2021 CKD-EPI creatinine equation that estimates kidney function   without a race variable.    GFR calc non Af Amer  Date Value Ref Range Status  01/17/2021 95 >59 mL/min/1.73 Final   eGFR  Date Value Ref Range Status  10/28/2022 102 >59 mL/min/1.73 Final         Passed - Patient is not pregnant      Passed - Valid encounter within last 12 months    Recent Outpatient Visits           10 months ago Left foot pain   [place] Larae Grooms, NP   1 year ago Annual physical exam   [place] [facility] Larae Grooms, NP   1 year ago Acute upper respiratory infection   [place] [facility] Larae Grooms, NP   1 year ago Sore throat    [facility] [place], Corrie Dandy T, NP   2 years ago Routine general medical examination at a health care facility   [facility], [place], [place]

## 2023-12-10 NOTE — Telephone Encounter (Signed)
Requested medication (s) are due for refill today: yes  Requested medication (s) are on the active medication list: yes  Last refill:  09/06/23  Future visit scheduled: yes  Notes to clinic:  Unable to refill per protocol due to failed labs, no updated results.      Requested Prescriptions  Pending Prescriptions Disp Refills   meloxicam (MOBIC) 15 MG tablet [Pharmacy Med Name: MELOXICAM 15 MG TABLET] 90 tablet 0    Sig: TAKE 1 TABLET (15 MG TOTAL) BY MOUTH DAILY.     Analgesics:  COX2 Inhibitors Failed - 12/10/2023  9:09 AM      Failed - Manual Review: Labs are only required if the patient has taken medication for more than 8 weeks.      Failed - HGB in normal range and within 360 days    Hemoglobin  Date Value Ref Range Status  10/28/2022 15.5 13.0 - 17.7 g/dL Final         Failed - Cr in normal range and within 360 days    Creatinine, Ser  Date Value Ref Range Status  10/28/2022 0.92 0.76 - 1.27 mg/dL Final         Failed - HCT in normal range and within 360 days    Hematocrit  Date Value Ref Range Status  10/28/2022 45.8 37.5 - 51.0 % Final         Failed - AST in normal range and within 360 days    AST  Date Value Ref Range Status  10/28/2022 15 0 - 40 IU/L Final         Failed - ALT in normal range and within 360 days    ALT  Date Value Ref Range Status  10/28/2022 17 0 - 44 IU/L Final         Failed - eGFR is 30 or above and within 360 days    GFR calc Af Amer  Date Value Ref Range Status  01/17/2021 110 >59 mL/min/1.73 Final    Comment:    **In accordance with recommendations from the NKF-ASN Task force,**   [facility] is in the process of updating its eGFR calculation to the   2021 CKD-EPI creatinine equation that estimates kidney function   without a race variable.    GFR calc non Af Amer  Date Value Ref Range Status  01/17/2021 95 >59 mL/min/1.73 Final   eGFR  Date Value Ref Range Status  10/28/2022 102 >59 mL/min/1.73 Final         Passed  - Patient is not pregnant      Passed - Valid encounter within last 12 months    Recent Outpatient Visits           10 months ago Left foot pain   [facility] Larae Grooms, NP   1 year ago Annual physical exam   [facility] Larae Grooms, NP   1 year ago Acute upper respiratory infection   [facility] Larae Grooms, NP   1 year ago Sore throat   [facility] [place], Corrie Dandy T, NP   2 years ago Routine general medical examination at a health care facility   [facility] [place] P, DO              Signed Prescriptions Disp Refills   rosuvastatin (CRESTOR) 5 MG tablet 90 tablet 0    Sig: TAKE 1 TABLET (5 MG  TOTAL) BY MOUTH DAILY.     Cardiovascular:  Antilipid - Statins 2 Failed - 12/10/2023  9:09 AM      Failed - Cr in normal range and within 360 days    Creatinine, Ser  Date Value Ref Range Status  10/28/2022 0.92 0.76 - 1.27 mg/dL Final         Failed - Lipid Panel in normal range within the last 12 months    Cholesterol, Total  Date Value Ref Range Status  10/28/2022 241 (H) 100 - 199 mg/dL Final   LDL Chol Calc (NIH)  Date Value Ref Range Status  10/28/2022 193 (H) 0 - 99 mg/dL Final   HDL  Date Value Ref Range Status  10/28/2022 36 (L) >39 mg/dL Final   Triglycerides  Date Value Ref Range Status  10/28/2022 73 0 - 149 mg/dL Final         Passed - Patient is not pregnant      Passed - Valid encounter within last 12 months    Recent Outpatient Visits           10 months ago Left foot pain   [facility] Larae Grooms, NP   1 year ago Annual physical exam   [facility] Larae Grooms, NP   1 year ago Acute upper respiratory infection   [facility] [facility] Larae Grooms, NP   1 year ago Sore throat   [place] [facility]  [place], Corrie Dandy T, NP   2 years ago Routine general medical examination at a health care facility   [facility] [place], DO

## 2024-02-16 ENCOUNTER — Ambulatory Visit: Payer: Self-pay

## 2024-02-16 NOTE — Telephone Encounter (Signed)
 Chief Complaint: Right leg pain Symptoms: muscle tightness, 8/10 Frequency: x 2 days Pertinent Negatives: Patient denies CP, SOB, fever, redness, warmth Disposition: [] ED /[] Urgent Care (no appt availability in office) / [x] Appointment(In office/virtual)/ []  [place] [facility]/ [] Home Care/ [] Refused Recommended Disposition /[] [place] [facility]/ []  Follow-up with PCP Additional Notes: Pt reports he was at work Monday night and noticed what felt like "I got hit with a bat in the back of the leg". Pt notes right calf has been painful when walking. Denies CP, SOB, redness, warmth. OV scheduled for tomorrow AM. This RN educated pt on home care, new-worsening symptoms, when to call back/seek emergent care. Pt verbalized understanding and agrees to plan.    Copied from CRM [id number]. Topic: Clinical - Red Word Triage >> Feb 16, 2024  4:28 PM Geroge Baseman wrote: Red Word that prompted transfer to Nurse Triage: Patient sates pain on the right leg, possible strain or tear, at work it felt like something hit him in the back of his leg but nothing did. Reason for Disposition  [1] MODERATE pain (e.g., interferes with normal activities, limping) AND [2] present > 3 days  Answer Assessment - Initial Assessment Questions 1. ONSET: "When did the pain start?"      X 2 days 2. LOCATION: "Where is the pain located?"      Right leg, back of calf 3. PAIN: "How bad is the pain?"    (Scale 1-10; or mild, moderate, severe)   -  MILD (1-3): doesn't interfere with normal activities    -  MODERATE (4-7): interferes with normal activities (e.g., work or school) or awakens from sleep, limping    -  SEVERE (8-10): excruciating pain, unable to do any normal activities, unable to walk     8/10, intermittent  5. CAUSE: "What do you think is causing the leg pain?"     Unknown 6. OTHER SYMPTOMS: "Do you have any other symptoms?" (e.g., chest pain, back pain, breathing difficulty, swelling, rash, fever, numbness,  weakness)     Muscle tightness  Protocols used: Leg Pain-A-AH

## 2024-02-17 ENCOUNTER — Encounter: Payer: Self-pay | Admitting: Nurse Practitioner

## 2024-02-17 ENCOUNTER — Ambulatory Visit (INDEPENDENT_AMBULATORY_CARE_PROVIDER_SITE_OTHER): Admitting: Nurse Practitioner

## 2024-02-17 VITALS — BP 130/79 | HR 80 | Ht 71.26 in | Wt 227.2 lb

## 2024-02-17 DIAGNOSIS — T148XXA Other injury of unspecified body region, initial encounter: Secondary | ICD-10-CM

## 2024-02-17 MED ORDER — PREDNISONE 10 MG PO TABS: 10.0000 mg | ORAL_TABLET | Freq: Every day | ORAL | 0 refills | Status: AC

## 2024-02-17 NOTE — Progress Notes (Signed)
 BP 130/79 (BP Location: Left Arm, Patient Position: Sitting, Cuff Size: Large)   Pulse 80   Ht 5' 11.26" (1.81 m)   Wt 227 lb 3.2 oz (103.1 kg)   SpO2 96%   BMI 31.46 kg/m    Subjective:    Patient ID: Kevin Cobb, male    DOB: August 25, 1971, 53 y.o.   MRN: [id number]  HPI: Kevin Cobb is a 53 y.o. male  Chief Complaint  Patient presents with   Leg Pain    Right calve, felt like muscle pull or tear, has taken muscle relaxer and meloxicam but nothing has been effective, pain started Monday night while at work    RIGHT LEG PAIN Right calve muscle.  States it started on Monday night.  Patient rates the pain at a 6/10.  Pain is worse with walking.  Pain improves with sitting or laying down- not being on it or putting pressure on it.  Might be slightly swollen.  No redness or bruising.  Patient has tried Meloxicam and flexeril which hasn't helped much with symptoms.  He hasn't noticed much improvement in symptoms.  Pain is worse with extension.     Relevant past medical, surgical, family and social history reviewed and updated as indicated. Interim medical history since our last visit reviewed. Allergies and medications reviewed and updated.  Review of Systems  Musculoskeletal:        Right calve pain    Per HPI unless specifically indicated above     Objective:    BP 130/79 (BP Location: Left Arm, Patient Position: Sitting, Cuff Size: Large)   Pulse 80   Ht 5' 11.26" (1.81 m)   Wt 227 lb 3.2 oz (103.1 kg)   SpO2 96%   BMI 31.46 kg/m   Wt Readings from Last 3 Encounters:  02/17/24 227 lb 3.2 oz (103.1 kg)  02/03/23 224 lb 4.8 oz (101.7 kg)  10/28/22 220 lb (99.8 kg)    Physical Exam Vitals and nursing note reviewed.  Constitutional:      General: He is not in acute distress.    Appearance: Normal appearance. He is not ill-appearing, toxic-appearing or diaphoretic.  HENT:     Head: Normocephalic.     Right Ear: External ear normal.     Left Ear: External  ear normal.     Nose: Nose normal. No congestion or rhinorrhea.     Mouth/Throat:     Mouth: Mucous membranes are moist.  Eyes:     General:        Right eye: No discharge.        Left eye: No discharge.     Extraocular Movements: Extraocular movements intact.     Conjunctiva/sclera: Conjunctivae normal.     Pupils: Pupils are equal, round, and reactive to light.  Cardiovascular:     Rate and Rhythm: Normal rate and regular rhythm.     Heart sounds: No murmur heard. Pulmonary:     Effort: Pulmonary effort is normal. No respiratory distress.     Breath sounds: Normal breath sounds. No wheezing, rhonchi or rales.  Abdominal:     General: Abdomen is flat. Bowel sounds are normal.  Musculoskeletal:     Cervical back: Normal range of motion and neck supple.     Right lower leg: Tenderness (mid calve muscle) present. No deformity, lacerations or bony tenderness.     Comments: 16in-L  16in- R No redness, swelling or bulge in her calve  Skin:  General: Skin is warm and dry.     Capillary Refill: Capillary refill takes less than 2 seconds.  Neurological:     General: No focal deficit present.     Mental Status: He is alert and oriented to person, place, and time.  Psychiatric:        Mood and Affect: Mood normal.        Behavior: Behavior normal.        Thought Content: Thought content normal.        Judgment: Judgment normal.     Results for orders placed or performed in visit on 10/28/22  Urinalysis, Routine w reflex microscopic   Collection Time: 10/28/22  9:33 AM  Result Value Ref Range   Specific Gravity, UA 1.025 1.005 - 1.030   pH, UA 5.5 5.0 - 7.5   Color, UA Yellow Yellow   Appearance Ur Clear Clear   Leukocytes,UA Negative Negative   Protein,UA Negative Negative/Trace   Glucose, UA Negative Negative   Ketones, UA Negative Negative   RBC, UA Negative Negative   Bilirubin, UA Negative Negative   Urobilinogen, Ur 0.2 0.2 - 1.0 mg/dL   Nitrite, UA Negative  Negative   Microscopic Examination Comment   TSH   Collection Time: 10/28/22  9:35 AM  Result Value Ref Range   TSH 1.260 0.450 - 4.500 uIU/mL  Lipid panel   Collection Time: 10/28/22  9:35 AM  Result Value Ref Range   Cholesterol, Total 241 (H) 100 - 199 mg/dL   Triglycerides 73 0 - 149 mg/dL   HDL 36 (L) >40 mg/dL   VLDL Cholesterol Cal 12 5 - 40 mg/dL   LDL Chol Calc (NIH) 981 (H) 0 - 99 mg/dL   Lipid Comment: Comment    Chol/HDL Ratio 6.7 (H) 0.0 - 5.0 ratio  CBC with Differential/Platelet   Collection Time: 10/28/22  9:35 AM  Result Value Ref Range   WBC 9.2 3.4 - 10.8 x10E3/uL   RBC 4.95 4.14 - 5.80 x10E6/uL   Hemoglobin 15.5 13.0 - 17.7 g/dL   Hematocrit 19.1 47.8 - 51.0 %   MCV 93 79 - 97 fL   MCH 31.3 26.6 - 33.0 pg   MCHC 33.8 31.5 - 35.7 g/dL   RDW 29.5 62.1 - 30.8 %   Platelets 221 150 - 450 x10E3/uL   Neutrophils 53 Not Estab. %   Lymphs 35 Not Estab. %   Monocytes 10 Not Estab. %   Eos 1 Not Estab. %   Basos 1 Not Estab. %   Neutrophils Absolute 4.9 1.4 - 7.0 x10E3/uL   Lymphocytes Absolute 3.2 (H) 0.7 - 3.1 x10E3/uL   Monocytes Absolute 0.9 0.1 - 0.9 x10E3/uL   EOS (ABSOLUTE) 0.1 0.0 - 0.4 x10E3/uL   Basophils Absolute 0.1 0.0 - 0.2 x10E3/uL   Immature Granulocytes 0 Not Estab. %   Immature Grans (Abs) 0.0 0.0 - 0.1 x10E3/uL  Comprehensive metabolic panel   Collection Time: 10/28/22  9:35 AM  Result Value Ref Range   Glucose 104 (H) 70 - 99 mg/dL   BUN 13 6 - 24 mg/dL   Creatinine, Ser 6.57 0.76 - 1.27 mg/dL   eGFR 846 >96 EX/BMW/4.13   BUN/Creatinine Ratio 14 9 - 20   Sodium 141 134 - 144 mmol/L   Potassium 4.3 3.5 - 5.2 mmol/L   Chloride 104 96 - 106 mmol/L   CO2 24 20 - 29 mmol/L   Calcium 9.5 8.7 - 10.2 mg/dL   Total  Protein 7.2 6.0 - 8.5 g/dL   Albumin 5.0 4.1 - 5.1 g/dL   Globulin, Total 2.2 1.5 - 4.5 g/dL   Albumin/Globulin Ratio 2.3 (H) 1.2 - 2.2   Bilirubin Total 0.6 0.0 - 1.2 mg/dL   Alkaline Phosphatase 65 44 - 121 IU/L   AST 15 0 -  40 IU/L   ALT 17 0 - 44 IU/L  HgB A1c   Collection Time: 10/28/22  9:35 AM  Result Value Ref Range   Hgb A1c MFr Bld 6.5 (H) 4.8 - 5.6 %   Est. average glucose Bld gHb Est-mCnc 140 mg/dL      Assessment & Plan:   Problem List Items Addressed This Visit   None Visit Diagnoses       Muscle strain    -  Primary   Will treat with prednisone. Continue with Meloxicam and Flexeril. If not improved on Monday, recommend seeing Orthopedics.        Follow up plan: Return if symptoms worsen or fail to improve.

## 2024-02-21 ENCOUNTER — Ambulatory Visit: Payer: Self-pay

## 2024-02-21 ENCOUNTER — Ambulatory Visit: Admitting: Internal Medicine

## 2024-02-21 DIAGNOSIS — M79604 Pain in right leg: Secondary | ICD-10-CM

## 2024-02-21 NOTE — Telephone Encounter (Signed)
 Called and notified patient of Karen's message. Patient requested to have scheduled appointment cancelled as he is going to do as Clydie Braun suggested. Provided patient with the phone number to [facility].

## 2024-02-21 NOTE — Telephone Encounter (Signed)
 Noted and sending to PCP to review.

## 2024-02-21 NOTE — Telephone Encounter (Signed)
 Please let patient know that he should be seen at Ortho. I have placed the referral for him.  He can call [facility] and likely get an appt for today.

## 2024-02-21 NOTE — Addendum Note (Signed)
 Addended by: Larae Grooms on: 02/21/2024 10:16 AM   Modules accepted: Orders

## 2024-02-21 NOTE — Telephone Encounter (Addendum)
  Chief Complaint: R leg swelling Symptoms: swelling, pain  Frequency: x 1 week Pertinent Negatives: Patient denies fever, redness, sx of infection, injuries, CP, SOB Disposition: [] ED /[] Urgent Care (no appt availability in office) / [x] Appointment(In office/virtual)/ []  [place] [facility]/ [] Home Care/ [] Refused Recommended Disposition /[] [place] [facility]/ []  Follow-up with PCP Additional Notes: Pt c/o R leg swelling and pain x 1 week. Recently had acute visit with PCP, and was given steroids with minimal relief. Denies CP, SOB, fever, injuries. Sx are persistent and worsening, esp. With leg usage. Scheduled patient per protocol on 02/21/2024 at alternate [facility]. Patient verbalized understanding and to call back/911 with worsening symptoms.     Copied from CRM [id number]. Topic: Clinical - Red Word Triage >> Feb 21, 2024  9:08 AM Izetta Dakin wrote: [facility] that prompted transfer to Nurse Triage: Rt leg pain, with swelling and bruising. Reason for Disposition  [1] Thigh, calf, or ankle swelling AND [2] only 1 side  Answer Assessment - Initial Assessment Questions 1. ONSET: "When did the swelling start?" (e.g., minutes, hours, days)     Last Monday - recent acute visit and prescribed prednisone with minimal relief 2. LOCATION: "What part of the leg is swollen?"  "Are both legs swollen or just one leg?"     Right leg 3. SEVERITY: "How bad is the swelling?" (e.g., localized; mild, moderate, severe)   - Localized: Small area of swelling localized to one leg.   - MILD pedal edema: Swelling limited to foot and ankle, pitting edema < 1/4 inch (6 mm) deep, rest and elevation eliminate most or all swelling.   - MODERATE edema: Swelling of lower leg to knee, pitting edema > 1/4 inch (6 mm) deep, rest and elevation only partially reduce swelling.   - SEVERE edema: Swelling extends above knee, facial or hand swelling present.      Swelling starts above ankle and upward to 3in below knee 4.  REDNESS: "Does the swelling look red or infected?"     Yellow tint on "the front of the leg". Denies redness Scab spot that has redness on the front of the shin 5. PAIN: "Is the swelling painful to touch?" If Yes, ask: "How painful is it?"   (Scale 1-10; mild, moderate or severe)     Pain with leg usage 6-8/10 6. FEVER: "Do you have a fever?" If Yes, ask: "What is it, how was it measured, and when did it start?"      no 7. CAUSE: "What do you think is causing the leg swelling?"     Denies injuries Reports running on Monday at work - reports that felt "something hit back of leg, but nothing hit it" 8. MEDICAL HISTORY: "Do you have a history of blood clots (e.g., DVT), cancer, heart failure, kidney disease, or liver failure?"     Denies hx of all above Reports "liver elevated at times' 9. RECURRENT SYMPTOM: "Have you had leg swelling before?" If Yes, ask: "When was the last time?" "What happened that time?"     First time 10. OTHER SYMPTOMS: "Do you have any other symptoms?" (e.g., chest pain, difficulty breathing)       Denies above  Protocols used: Leg Swelling and Edema-A-AH

## 2024-02-25 DIAGNOSIS — S86111A Strain of other muscle(s) and tendon(s) of posterior muscle group at lower leg level, right leg, initial encounter: Secondary | ICD-10-CM | POA: Diagnosis not present

## 2024-03-03 DIAGNOSIS — S86111A Strain of other muscle(s) and tendon(s) of posterior muscle group at lower leg level, right leg, initial encounter: Secondary | ICD-10-CM | POA: Diagnosis not present

## 2024-03-28 DIAGNOSIS — S86111D Strain of other muscle(s) and tendon(s) of posterior muscle group at lower leg level, right leg, subsequent encounter: Secondary | ICD-10-CM | POA: Diagnosis not present

## 2024-06-27 ENCOUNTER — Encounter: Admitting: Nurse Practitioner
